# Patient Record
Sex: Male | Born: 1980 | Race: Asian | Marital: Married | Smoking: Former smoker
Health system: Southern US, Community
[De-identification: ages and names within clinical notes are randomized; demographics above are authoritative.]

## PROBLEM LIST (undated history)

## (undated) DIAGNOSIS — I82A12 Acute embolism and thrombosis of left axillary vein: Secondary | ICD-10-CM

## (undated) DIAGNOSIS — S4992XA Unspecified injury of left shoulder and upper arm, initial encounter: Secondary | ICD-10-CM

## (undated) DIAGNOSIS — G540 Brachial plexus disorders: Secondary | ICD-10-CM

## (undated) HISTORY — PX: DEEP NECK LYMPH NODE BIOPSY / EXCISION: SUR126

## (undated) HISTORY — DX: Brachial plexus disorders: G54.0

---

## 2012-08-19 ENCOUNTER — Other Ambulatory Visit: Payer: Self-pay

## 2012-08-19 ENCOUNTER — Inpatient Hospital Stay (HOSPITAL_COMMUNITY)
Admission: EM | Admit: 2012-08-19 | Discharge: 2012-08-24 | DRG: 111 | Disposition: A | Discharge status: Home / Self Care | Payer: BC Managed Care – PPO | Attending: Cardiology | Admitting: Cardiology

## 2012-08-19 ENCOUNTER — Inpatient Hospital Stay (HOSPITAL_COMMUNITY): Payer: BC Managed Care – PPO

## 2012-08-19 ENCOUNTER — Encounter (HOSPITAL_COMMUNITY): Payer: Self-pay | Admitting: Emergency Medicine

## 2012-08-19 DIAGNOSIS — I82A22 Chronic embolism and thrombosis of left axillary vein: Secondary | ICD-10-CM

## 2012-08-19 DIAGNOSIS — Z87891 Personal history of nicotine dependence: Secondary | ICD-10-CM

## 2012-08-19 DIAGNOSIS — I82A19 Acute embolism and thrombosis of unspecified axillary vein: Principal | ICD-10-CM | POA: Diagnosis present

## 2012-08-19 DIAGNOSIS — R609 Edema, unspecified: Secondary | ICD-10-CM | POA: Diagnosis present

## 2012-08-19 DIAGNOSIS — I82A12 Acute embolism and thrombosis of left axillary vein: Secondary | ICD-10-CM | POA: Diagnosis present

## 2012-08-19 DIAGNOSIS — I82B19 Acute embolism and thrombosis of unspecified subclavian vein: Secondary | ICD-10-CM | POA: Diagnosis present

## 2012-08-19 DIAGNOSIS — R600 Localized edema: Secondary | ICD-10-CM | POA: Diagnosis present

## 2012-08-19 HISTORY — DX: Unspecified injury of left shoulder and upper arm, initial encounter: S49.92XA

## 2012-08-19 HISTORY — DX: Acute embolism and thrombosis of left axillary vein: I82.A12

## 2012-08-19 LAB — APTT: aPTT: 30 seconds (ref 24–37)

## 2012-08-19 LAB — CBC
Hemoglobin: 16.8 g/dL (ref 13.0–17.0)
MCH: 30.7 pg (ref 26.0–34.0)
Platelets: 214 10*3/uL (ref 150–400)
RBC: 5.47 MIL/uL (ref 4.22–5.81)
WBC: 14.1 10*3/uL — ABNORMAL HIGH (ref 4.0–10.5)

## 2012-08-19 LAB — PROTIME-INR
INR: 1 (ref 0.00–1.49)
Prothrombin Time: 13.1 seconds (ref 11.6–15.2)

## 2012-08-19 MED ORDER — IOHEXOL 300 MG/ML  SOLN
80.0000 mL | Freq: Once | INTRAMUSCULAR | Status: AC | PRN
  Administered 2012-08-19: 80 mL via INTRAVENOUS

## 2012-08-19 MED ORDER — HEPARIN (PORCINE) IN NACL 100-0.45 UNIT/ML-% IJ SOLN
850.0000 [IU]/h | INTRAMUSCULAR | Status: DC
  Administered 2012-08-19 – 2012-08-21 (×3): 1150 [IU]/h via INTRAVENOUS
  Filled 2012-08-19 (×5): qty 250

## 2012-08-19 MED ORDER — HEPARIN BOLUS VIA INFUSION
4200.0000 [IU] | Freq: Once | INTRAVENOUS | Status: AC
  Administered 2012-08-19: 4200 [IU] via INTRAVENOUS

## 2012-08-19 NOTE — Progress Notes (Signed)
ANTICOAGULATION CONSULT NOTE - Initial Consult  Pharmacy Consult for Heparin Indication: DVT, possible UE  No Known Allergies  Patient Measurements:   Pt reported:  Ht: 5 ft 11 in Wt: 153 lbs (69.5 kg)   Vital Signs: Temp: 98.5 F (36.9 C) (05/12 1715) Temp src: Oral (05/12 1715) BP: 137/97 mmHg (05/12 1715) Pulse Rate: 90 (05/12 1715)  Labs: No results found for this basename: HGB, HCT, PLT, APTT, LABPROT, INR, HEPARINUNFRC, CREATININE, CKTOTAL, CKMB, TROPONINI,  in the last 72 hours  CrCl is unknown because no creatinine reading has been taken and the patient has no height on file.   Medical History: Past Medical History  Diagnosis Date  . Injury of left shoulder     Assessment: 32 y/o M with swelling in the left arm to start IV heparin until DVT can be ruled out. No bleeding issues per pt. No labs back yet but have been ordered STAT and will follow-up.   Goal of Therapy:  Heparin level 0.3-0.7 units/ml Monitor platelets by anticoagulation protocol: Yes   Plan:  -Heparin 4200 units BOLUS x 1 -Start heparin infusion at 1150 units/hr -Check 6 hour HL at 0100 -Daily CBC/HL -Monitor for bleeding -f/u labs  Abran Duke, PharmD Clinical Pharmacist Phone: [phone] Pager: [phone] 08/19/2012 6:07 PM

## 2012-08-19 NOTE — H&P (Signed)
Trevor Spence is an 32 y.o. male.   Chief Complaint:  Swelling in the left arm HPI:   The patient is a 32 year old male with history of left shoulder injury in the past as well as a fatty lipoma resection on the right side of his neck. Patient reports swelling left upper extremity beginning approximately last Thursday or Friday. He does not report any recent trauma. He has not played any sports lately.  He has no family history of blood clotting.  Does have a history of smoking socially but quit approximately 2 years ago.  He was seen by Trevor Felts PA today and upper extremity arterial and venous Dopplers were completed.  He was subsequently sent to [facility].  Patient denies numbness or pain in the left upper ext. As well as nausea, vomiting, fever, shortness of breath, lower extremity edema, chest pain, hematuria, hematochezia.  Medications, none  Past Medical History  Diagnosis Date  . Injury of left shoulder     Past Surgical History  Procedure Laterality Date  . Deep neck lymph node biopsy / excision      History reviewed. No pertinent family history. Social History:  reports that he has quit smoking. He does not have any smokeless tobacco history on file. He reports that he does not drink alcohol or use illicit drugs.  Allergies: Allergies no known allergies   (Not in a hospital admission)  No results found for this or any previous visit (from the past 48 hour(s)). No results found.  Review of Systems  Constitutional: Negative for fever and diaphoresis.  HENT: Negative for congestion and sore throat.   Respiratory: Negative for cough and shortness of breath.   Cardiovascular: Negative for chest pain, orthopnea, leg swelling and PND.  Gastrointestinal: Negative for nausea, vomiting, abdominal pain, blood in stool and melena.  Genitourinary: Negative for hematuria.  Neurological: Negative for weakness.    Blood pressure 137/97, pulse 90, temperature 98.5 F  (36.9 C), temperature source Oral, resp. rate 25, SpO2 99.00%. Physical Exam  Constitutional: He is oriented to person, place, and time. He appears well-developed and well-nourished. No distress.  HENT:  Head: Normocephalic and atraumatic.  Mouth/Throat: No oropharyngeal exudate.  Eyes: EOM are normal. Pupils are equal, round, and reactive to light.  Neck: Normal range of motion. Neck supple.  Cardiovascular: Normal rate, regular rhythm, S1 normal and S2 normal.   No murmur heard. Pulses:      Radial pulses are 2+ on the right side, and 2+ on the left side.       Dorsalis pedis pulses are 2+ on the right side, and 2+ on the left side.  No carotid bruit  Respiratory: Effort normal and breath sounds normal. He has no wheezes. He has no rales. He exhibits no tenderness.  GI: Bowel sounds are normal.  Musculoskeletal: He exhibits edema.  Left upper ext  Lymphadenopathy:    He has no cervical adenopathy.  Neurological: He is alert and oriented to person, place, and time. He exhibits normal muscle tone.  Skin: Skin is warm and dry.  Left upper extremity:  Venous engorgement just below the deltoid.  Psychiatric: He has a normal mood and affect.     Assessment/Plan Principal Problem:   Edema of upper extremity   Plan:  Concern for upper ext venous thrombus.  The patient will be admitted for observation and placed on IV heparin.  Will order upper ext venous doppler.  Labs:  Cbc, bmet, hypercoag panel.  If doppler positive for DVT, will need to consider long-term anticoagulation.  Xarelto would be a good choice.  Trevor Spence 08/19/2012, 5:27 PM  I have seen and evaluated the patient this PM along with Trevor Finlay, PA. I agree with his findings, examination as well as impression recommendations.  Very interesting situation -- young man with no h/o clotting disorder p/w what appears to be DVT in the L Brachiocephalic / Innominate vein.  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   Saw a Chiropractor over the winter & had manipulations that did not help, has been doing stretching & ergonomic positioning with some help.  Despite this, Sx have returned,but over the past few days has noted progressive swelling & tightness in his L arm -- ruddy complexion with new varicosities.  Reportedly had dopplers done @ PCP office (unfortunately we do not have access to the results at this time, after hours).  I agree with hypercoag work-up as Upper extremity DVTs are uncommon.  No Sx of SVC syndrome, as no facial swelling & no IJ engorgement.  He has never been instrumented.  ?? Possibility of thoracic outlet syndrome type external venous compression that may have been made worse by manipulation & persistent unusual arm positioning while working at his computer desk.   He is otherwise completely stable & has no complaints.   I discussed the appropriate imaging modality with Radiology & CT Chest "Venogram" with injection from L arm would hopefully show both vascular and MSK anatomy.    Agree with anticoagulation long term.    More plans to follow after imaging -- I suspect that he may need Vascular Sgx vs. IR consultation, ? For thrombolysis.   Trevor Spence, M.D., M.S. [facility] [address] [place], [place]  [address]  [phone] Pager # [phone] 08/19/2012 7:07 PM

## 2012-08-19 NOTE — ED Notes (Signed)
Pt began to notice some discoloration and swelling to his left arm that started on 08-15-12. Pt has hx of blood clots. Pt states he has some tightness to the arm but no pain. Pt denies cp or sob. Visible discoloration and swelling to the left arm.

## 2012-08-19 NOTE — ED Notes (Signed)
Dr. Leron Croak at bedside.

## 2012-08-20 ENCOUNTER — Encounter (HOSPITAL_COMMUNITY): Payer: Self-pay | Admitting: Cardiology

## 2012-08-20 DIAGNOSIS — G540 Brachial plexus disorders: Secondary | ICD-10-CM

## 2012-08-20 DIAGNOSIS — I82A12 Acute embolism and thrombosis of left axillary vein: Secondary | ICD-10-CM

## 2012-08-20 DIAGNOSIS — R609 Edema, unspecified: Secondary | ICD-10-CM

## 2012-08-20 HISTORY — DX: Acute embolism and thrombosis of left axillary vein: I82.A12

## 2012-08-20 HISTORY — DX: Brachial plexus disorders: G54.0

## 2012-08-20 LAB — CBC
MCH: 30.1 pg (ref 26.0–34.0)
MCHC: 35 g/dL (ref 30.0–36.0)
Platelets: 199 10*3/uL (ref 150–400)
RDW: 13 % (ref 11.5–15.5)

## 2012-08-20 LAB — HEPARIN LEVEL (UNFRACTIONATED)
Heparin Unfractionated: 0.47 IU/mL (ref 0.30–0.70)
Heparin Unfractionated: 0.54 IU/mL (ref 0.30–0.70)
Heparin Unfractionated: 0.63 IU/mL (ref 0.30–0.70)

## 2012-08-20 NOTE — Progress Notes (Signed)
ANTICOAGULATION CONSULT NOTE - FOLLOW-UP   HL = 0.47 (goal 0.3 - 0.7 units/mL) Heparin dosing weight = 70 kg   Assessment: 32 YOM on IV heparin for thrombus seen within the central left axillary vein and left subclavian vein.  Heparin level therapeutic; no bleeding reported.   Plan: - Continue heparin gtt at 1150 units/hr - F/U AM labs  - F/U start oral anticoagulation     Mirtie Bastyr D. Laney Potash, PharmD, BCPS Pager:  [phone] 08/20/2012, 1:48 AM

## 2012-08-20 NOTE — Progress Notes (Signed)
Utilization review completed. Ferdinand Revoir, RN, BSN. 

## 2012-08-20 NOTE — Progress Notes (Signed)
Subjective: Anxious about treatment  Objective: Vital signs in last 24 hours: Temp:  [97.9 F (36.6 C)-98.5 F (36.9 C)] 98.4 F (36.9 C) (05/13 1358) Pulse Rate:  [54-90] 73 (05/13 1358) Resp:  [14-20] 18 (05/13 1358) BP: (109-141)/(64-88) 126/69 mmHg (05/13 1358) SpO2:  [98 %-100 %] 100 % (05/13 1358) Weight:  [154 lb 1.6 oz (69.9 kg)] 154 lb 1.6 oz (69.9 kg) (05/12 2002) Weight change:  Last BM Date: 08/19/12 Intake/Output from previous day: 05/12 0701 - 05/13 0700 In: 92.8 [I.V.:92.8] Out: -  Intake/Output this shift: Total I/O In: 421.4 [P.O.:320; I.V.:101.4] Out: -   PE:  Per Dr. Allyson Sabal General:alert and oriented, NAD Heart:S1S2 RRR, without murmur gallup rub or click Lungs:clear without rales rhonchi or wheezes Abd:+ BS soft non tender Ext:Lt upper arm swollen with vascular congestion.    Lab Results:  Recent Labs  08/19/12 1827 08/20/12 0950  WBC 14.1* 9.9  HGB 16.8 15.2  HCT 46.5 43.4  PLT 214 199     EKG: Orders placed during the hospital encounter of 08/19/12  . EKG    Studies/Results: Ct Chest W Contrast  08/19/2012  *RADIOLOGY REPORT*  Clinical Data: Left upper extremity thrombus.  Suspected thoracic outlet syndrome.  CT CHEST WITH CONTRAST  Technique:  Multidetector CT imaging of the chest was performed following the standard protocol during bolus administration of intravenous contrast.  Contrast: 80mL OMNIPAQUE IOHEXOL 300 MG/ML  SOLN  Comparison: None.  Findings: Injection of contrast was performed in the left upper extremity.  This demonstrates filling defects within the left subclavian vein and central left axillary vein.  The subclavian vein is not well visualized near the thoracic inlet.  Extensive collateral venous channels noted in the left upper extremity and upper chest/neck with drainage through the left internal jugular vein.  Heart is normal size.  Aorta is normal caliber. No mediastinal, hilar, or axillary adenopathy.  The Lungs are  clear.  No focal airspace opacities or suspicious nodules.  No effusions. Imaging into the upper abdomen shows no acute findings.  Tiny low density area within the superior right hepatic lobe is too small to characterize, most likely small cysts.  No acute bony abnormality.  IMPRESSION: Thrombus seen within the central left axillary vein and left subclavian vein.  The left subclavian vein is difficult to visualize near the thoracic inlet.  Extensive collateral venous channels in the left neck and chest with drainage through the left internal jugular vein.   Original Report Authenticated By: Charlett Nose, M.D.     Medications: I have reviewed the patient's current medications. Scheduled Meds:  Continuous Infusions: . heparin 1,150 Units/hr (08/20/12 1317)   PRN Meds:.  Assessment/Plan: Principal Problem:   Edema of upper extremity Active Problems:   DVT of left axillary vein, acute, and Lt subclavian vein  PLAN: hyper coag. Panel we believe drawn at [facility] medical.  See Dr. Hazle Coca note.  LOS: 1 day   Time spent with pt. :20 minutes including MD time.Leone Brand  Nurse Practitioner Certified Pager [phone] 08/20/2012, 4:47 PM   Agree with note written by Nada Boozer RNP  LUE trombosis by CT. No clear etiology. On IV hep. Hyper coag panel sent. Will get IR to see tomorrow. May need venogram and thrombolysis. ? Thoracic outlet syndrome.  Runell Gess 08/20/2012 5:07 PM

## 2012-08-20 NOTE — Progress Notes (Signed)
Pt inquiring when MD when round, pt little anxious to know the plan, ?meds, surgery ???. Nada Boozer, NP with Dr Herbie Baltimore paged, plan to round on pt within the hour. Pt informed.

## 2012-08-20 NOTE — Care Management Note (Signed)
    Page 1 of 1   08/23/2012     12:43:21 PM   CARE MANAGEMENT NOTE 08/23/2012  Patient:  Trevor Spence, Trevor Spence   Account Number:  [id number]  Date Initiated:  08/20/2012  Documentation initiated by:  Letha Cape  Subjective/Objective Assessment:   dx edema of uppper ext  admit- lives with spouse.     Action/Plan:   Anticipated DC Date:  08/22/2012   Anticipated DC Plan:  HOME/SELF CARE      DC Planning Services  CM consult      Choice offered to / List presented to:             Status of service:  In process, will continue to follow Medicare Important Message given?   (If response is "NO", the following Medicare IM given date fields will be blank) Date Medicare IM given:   Date Additional Medicare IM given:    Discharge Disposition:    Per UR Regulation:  Reviewed for med. necessity/level of care/duration of stay  If discussed at Long Length of Stay Meetings, dates discussed:    Comments:  08-23-12 12:40am [name], RNBSN - [phone] PER [facility] XARELTO 15 MGS OR 20 IS $60/COPAY FOR 30 DAYS - NO PRIOR AUTH REQ - CAN USE ANY LOCAL DRUG.  Can give 30 day free card to start with if choose to go this route.  08-22-13  2:30pm [name] - [phone] tx to ICU for thoracic outlet syndrome.   Infusion catheter placed for overnight TNK administration.   08/20/12 16:42 Letha Cape RN, BSN [phone] patient lives with spouse, NCM will continue to follow for dc needs.

## 2012-08-20 NOTE — Progress Notes (Signed)
Pharmacy Consult-Anticoagulation  Pharmacy Consult:  32 y/o male is currently on heparin protocol for L-axillary vein thrombus   Current Labs: Hematology  Recent Labs  08/19/12 1827 08/20/12 0032 08/20/12 0950  HGB 16.8  --  15.2  HCT 46.5  --  43.4  PLT 214  --  199  APTT 30  --   --   LABPROT 13.1  --   --   INR 1.00  --   --   HEPARINUNFRC  --  0.47 0.54   Lab Results  Component Value Date   INR 1.00 08/19/2012   Current Meds:  [COMPLETED] heparin 4,200 Units Intravenous Once  Heparin Infusion        1150 units/hr  Assessment:  Heparin level is therapeutic 0.54 units/ml.  No bleeding complications observed.  Goal/Plan:  Heparin goal is 0.3 - 0.7 units/ml.  Heparin will be continued at the same rate, 1150 units/hr.    Daily Heparin level and CBC while on Heparin.  Monitor for bleeding complications   Laurena Bering, Pharm.D. 08/20/2012  3:05 PM

## 2012-08-21 DIAGNOSIS — R609 Edema, unspecified: Secondary | ICD-10-CM

## 2012-08-21 LAB — COMPREHENSIVE METABOLIC PANEL
AST: 15 U/L (ref 0–37)
Albumin: 3.6 g/dL (ref 3.5–5.2)
Alkaline Phosphatase: 50 U/L (ref 39–117)
CO2: 28 mEq/L (ref 19–32)
Chloride: 105 mEq/L (ref 96–112)
Potassium: 3.3 mEq/L — ABNORMAL LOW (ref 3.5–5.1)
Total Bilirubin: 0.5 mg/dL (ref 0.3–1.2)

## 2012-08-21 LAB — HEPARIN LEVEL (UNFRACTIONATED): Heparin Unfractionated: 0.63 IU/mL (ref 0.30–0.70)

## 2012-08-21 LAB — CBC
HCT: 41.8 % (ref 39.0–52.0)
Platelets: 204 10*3/uL (ref 150–400)
RBC: 4.91 MIL/uL (ref 4.22–5.81)
RDW: 12.5 % (ref 11.5–15.5)
WBC: 9.3 10*3/uL (ref 4.0–10.5)

## 2012-08-21 MED ORDER — POTASSIUM CHLORIDE CRYS ER 20 MEQ PO TBCR
40.0000 meq | EXTENDED_RELEASE_TABLET | Freq: Once | ORAL | Status: AC
  Administered 2012-08-21: 40 meq via ORAL
  Filled 2012-08-21: qty 2

## 2012-08-21 NOTE — Progress Notes (Addendum)
Admitted pt.to 5532,from ED ,AAO X4,bec.of swelling & sl discolored LT.Upper arm.He lives at home with wife.Oriented pt. To the unit & instructed to use call bell for any help.In pt.ID bracelet applied to pt.after verifying w/ pt.;T=97.9;P=74;R=16;B/P=127/85;02 SAT.=100 % RA. Will continue to monitor pt./.Stevon Gough.RN

## 2012-08-21 NOTE — Progress Notes (Signed)
[facility]  Subjective: Arm hurts in certain positions.  Swelling better.  Objective: Vital signs in last 24 hours: Temp:  [98 F (36.7 C)-99 F (37.2 C)] 98 F (36.7 C) (05/14 0607) Pulse Rate:  [62-73] 62 (05/14 0607) Resp:  [16-18] 16 (05/14 0607) BP: (100-126)/(63-69) 100/65 mmHg (05/14 0607) SpO2:  [98 %-100 %] 98 % (05/14 0607) Last BM Date: 08/20/12  Intake/Output from previous day: 05/13 0701 - 05/14 0700 In: 708.4 [P.O.:470; I.V.:238.4] Out: -  Intake/Output this shift:    Medications Current Facility-Administered Medications  Medication Dose Route Frequency Provider Last Rate Last Dose  . heparin ADULT infusion 100 units/mL (25000 units/250 mL)  1,150 Units/hr Intravenous Continuous Abran Duke, RPH 11.5 mL/hr at 08/20/12 1317 1,150 Units/hr at 08/20/12 1317    PE: General appearance: alert, cooperative and no distress Lungs: clear to auscultation bilaterally Heart: regular rate and rhythm, S1, S2 normal, no murmur, click, rub or gallop Extremities: 1+ Upper left ext edema Pulses: 2+ and symmetric Neurologic: Grossly normal  Lab Results:   Recent Labs  08/19/12 1827 08/20/12 0950 08/21/12 0625  WBC 14.1* 9.9 9.3  HGB 16.8 15.2 14.8  HCT 46.5 43.4 41.8  PLT 214 199 204   BMET  Recent Labs  08/21/12 0625  NA 140  K 3.3*  CL 105  CO2 28  GLUCOSE 96  BUN 12  CREATININE 1.09  CALCIUM 9.5   PT/INR  Recent Labs  08/19/12 1827  LABPROT 13.1  INR 1.00      Assessment/Plan   Principal Problem:   DVT of left axillary vein, acute, and Lt subclavian vein Active Problems:   Edema of upper extremity  Plan:  IR consult today.  Drawing Garment/textile technologist.  There was a problem with the one from [facility] and it was not completed.  Continue IV heparin.  Longterm anticoag TBD.    LOS: 2 days    HAGER, BRYAN 08/21/2012 10:18 AM  I have seen and evaluated the patient this PM along with [name],  PA. I agree with his findings, examination as well as impression recommendations.  Admitted with LSCV & Axiallry thrombosis -- confirmed by CT Venography. Unfortunately hypercoagulation work-up was corrupt -- now that he is no heparin, not sure how helpful it would be (will need to check after 3-6 month when anticoagulation can be held.  Otherwise, unsure of etiology -- ? Abnormal work ergodynamics, vs. Thoracic outlet syndrome related.  Appreciate VIR consultation for Venogram & possible Angioplasty-stenting / direct thrombolysis -- planned tomorrow.  For now, continue on IV Heparin & pain control.   Marykay Lex, M.D., M.S. [facility] [address] [place], [place]  [address]  [phone] Pager # [phone] 08/21/2012 12:56 PM

## 2012-08-21 NOTE — Progress Notes (Signed)
Patient ID: Trevor Spence, male   DOB: [birth year], 32 y.o.   MRN: [id number] Request received for left upper venography with possible angioplasty/stenting/thrombolysis in pt with recently diagnosed left axillary/subclavian DVT. Pt reports approx. 1 week hx of LUE swelling. No hx trauma, cancer or recent major surgery.  No prior hx of thrombosis. Imaging studies were reviewed by Dr. Miles Costain. Additional PMH as below.  Exam: pt awake/alert; chest- CTA bilat., heart- RRR; abd- soft,+BS,NT/ND; ext- 1-2+ LUE edema from deltoid down; no RUE/RLE/LLE edema; intact peripheral pulses; sens fxn intact.   Filed Vitals:   08/20/12 0627 08/20/12 1358 08/20/12 2024 08/21/12 0607  BP: 109/64 126/69 110/63 100/65  Pulse: 54 73 63 62  Temp: 97.9 F (36.6 C) 98.4 F (36.9 C) 99 F (37.2 C) 98 F (36.7 C)  TempSrc: Oral Oral Oral Oral  Resp: 18 18 16 16   Height:      Weight:      SpO2: 100% 100% 98% 98%   Past Medical History  Diagnosis Date  . Injury of left shoulder   . DVT of left axillary vein, acute 08/20/2012   Past Surgical History  Procedure Laterality Date  . Deep neck lymph node biopsy / excision     Ct Chest W Contrast  08/19/2012   *RADIOLOGY REPORT*  Clinical Data: Left upper extremity thrombus.  Suspected thoracic outlet syndrome.  CT CHEST WITH CONTRAST  Technique:  Multidetector CT imaging of the chest was performed following the standard protocol during bolus administration of intravenous contrast.  Contrast: 80mL OMNIPAQUE IOHEXOL 300 MG/ML  SOLN  Comparison: None.  Findings: Injection of contrast was performed in the left upper extremity.  This demonstrates filling defects within the left subclavian vein and central left axillary vein.  The subclavian vein is not well visualized near the thoracic inlet.  Extensive collateral venous channels noted in the left upper extremity and upper chest/neck with drainage through the left internal jugular vein.  Heart is normal size.  Aorta is normal  caliber. No mediastinal, hilar, or axillary adenopathy.  The Lungs are clear.  No focal airspace opacities or suspicious nodules.  No effusions. Imaging into the upper abdomen shows no acute findings.  Tiny low density area within the superior right hepatic lobe is too small to characterize, most likely small cysts.  No acute bony abnormality.  IMPRESSION: Thrombus seen within the central left axillary vein and left subclavian vein.  The left subclavian vein is difficult to visualize near the thoracic inlet.  Extensive collateral venous channels in the left neck and chest with drainage through the left internal jugular vein.   Original Report Authenticated By: Charlett Nose, M.D.  Results for orders placed during the hospital encounter of 08/19/12  CBC      Result Value Range   WBC 14.1 (*) 4.0 - 10.5 K/uL   RBC 5.47  4.22 - 5.81 MIL/uL   Hemoglobin 16.8  13.0 - 17.0 g/dL   HCT 78.2  95.6 - 21.3 %   MCV 85.0  78.0 - 100.0 fL   MCH 30.7  26.0 - 34.0 pg   MCHC 36.1 (*) 30.0 - 36.0 g/dL   RDW 08.6  57.8 - 46.9 %   Platelets 214  150 - 400 K/uL  APTT      Result Value Range   aPTT 30  24 - 37 seconds  PROTIME-INR      Result Value Range   Prothrombin Time 13.1  11.6 - 15.2 seconds   INR  1.00  0.00 - 1.49  HEPARIN LEVEL (UNFRACTIONATED)      Result Value Range   Heparin Unfractionated 0.47  0.30 - 0.70 IU/mL  HEPARIN LEVEL (UNFRACTIONATED)      Result Value Range   Heparin Unfractionated 0.54  0.30 - 0.70 IU/mL  CBC      Result Value Range   WBC 9.9  4.0 - 10.5 K/uL   RBC 5.05  4.22 - 5.81 MIL/uL   Hemoglobin 15.2  13.0 - 17.0 g/dL   HCT 16.1  09.6 - 04.5 %   MCV 85.9  78.0 - 100.0 fL   MCH 30.1  26.0 - 34.0 pg   MCHC 35.0  30.0 - 36.0 g/dL   RDW 40.9  81.1 - 91.4 %   Platelets 199  150 - 400 K/uL  HEPARIN LEVEL (UNFRACTIONATED)      Result Value Range   Heparin Unfractionated 0.63  0.30 - 0.70 IU/mL  HEPARIN LEVEL (UNFRACTIONATED)      Result Value Range   Heparin Unfractionated  0.63  0.30 - 0.70 IU/mL  CBC      Result Value Range   WBC 9.3  4.0 - 10.5 K/uL   RBC 4.91  4.22 - 5.81 MIL/uL   Hemoglobin 14.8  13.0 - 17.0 g/dL   HCT 78.2  95.6 - 21.3 %   MCV 85.1  78.0 - 100.0 fL   MCH 30.1  26.0 - 34.0 pg   MCHC 35.4  30.0 - 36.0 g/dL   RDW 08.6  57.8 - 46.9 %   Platelets 204  150 - 400 K/uL  COMPREHENSIVE METABOLIC PANEL      Result Value Range   Sodium 140  135 - 145 mEq/L   Potassium 3.3 (*) 3.5 - 5.1 mEq/L   Chloride 105  96 - 112 mEq/L   CO2 28  19 - 32 mEq/L   Glucose, Bld 96  70 - 99 mg/dL   BUN 12  6 - 23 mg/dL   Creatinine, Ser 6.29  0.50 - 1.35 mg/dL   Calcium 9.5  8.4 - 52.8 mg/dL   Total Protein 6.8  6.0 - 8.3 g/dL   Albumin 3.6  3.5 - 5.2 g/dL   AST 15  0 - 37 U/L   ALT 25  0 - 53 U/L   Alkaline Phosphatase 50  39 - 117 U/L   Total Bilirubin 0.5  0.3 - 1.2 mg/dL   GFR calc non Af Amer 87 (*) >90 mL/min   GFR calc Af Amer >90  >90 mL/min   A/P: Pt with acute left axillary/subclavian venous thrombosis, most likely related to thoracic outlet syndrome. Plan is for LUE venography with possible angioplasty/stenting/catheter directed thrombolysis on 5/15. Details/risks of procedure d/w pt with his understanding and consent. If thrombolysis initiated 5/15 pt will be transferred to ICU for monitoring with daily venography to assess progress. Dr. Miles Costain has seen and evaluated pt .

## 2012-08-21 NOTE — Progress Notes (Signed)
ANTICOAGULATION CONSULT NOTE - Follow Up Consult  Pharmacy Consult for Heparin Indication: DVT  No Known Allergies  Patient Measurements: Height: 5\' 10"  (177.8 cm) Weight: 154 lb 1.6 oz (69.9 kg) IBW/kg (Calculated) : 73  Vital Signs: Temp: 98 F (36.7 C) (05/14 0607) Temp src: Oral (05/14 0607) BP: 100/65 mmHg (05/14 0607) Pulse Rate: 62 (05/14 0607)  Labs:  Recent Labs  08/19/12 1827  08/20/12 0950 08/20/12 1500 08/21/12 0625  HGB 16.8  --  15.2  --  14.8  HCT 46.5  --  43.4  --  41.8  PLT 214  --  199  --  204  APTT 30  --   --   --   --   LABPROT 13.1  --   --   --   --   INR 1.00  --   --   --   --   HEPARINUNFRC  --   < > 0.54 0.63 0.63  CREATININE  --   --   --   --  1.09  < > = values in this interval not displayed.  Estimated Creatinine Clearance: 94.4 ml/min (by C-G formula based on Cr of 1.09).   Medications:  Infusions:  . heparin 1,150 Units/hr (08/20/12 1317)    Assessment: 32 year old male who continues on anticoagulation with Heparin for left arm DVT.  Heparin level is therapeutic, CBC is stable.  Goal of Therapy:  Heparin level 0.3-0.7 units/ml Monitor platelets by anticoagulation protocol: Yes   Plan:  Continue Heparin at 1150 units/hr Check AM Heparin level and CBC  Estella Husk, Pharm.D., BCPS Clinical Pharmacist  Phone [phone] Pager [phone] 08/21/2012, 12:01 PM

## 2012-08-22 ENCOUNTER — Inpatient Hospital Stay (HOSPITAL_COMMUNITY): Payer: BC Managed Care – PPO

## 2012-08-22 DIAGNOSIS — M7989 Other specified soft tissue disorders: Secondary | ICD-10-CM

## 2012-08-22 DIAGNOSIS — G540 Brachial plexus disorders: Secondary | ICD-10-CM

## 2012-08-22 DIAGNOSIS — I82A19 Acute embolism and thrombosis of unspecified axillary vein: Principal | ICD-10-CM

## 2012-08-22 LAB — FACTOR 5 LEIDEN

## 2012-08-22 LAB — CBC
HCT: 39.7 % (ref 39.0–52.0)
Hemoglobin: 14.3 g/dL (ref 13.0–17.0)
MCH: 30.5 pg (ref 26.0–34.0)
MCV: 85.3 fL (ref 78.0–100.0)
Platelets: 216 10*3/uL (ref 150–400)
RBC: 5.05 MIL/uL (ref 4.22–5.81)
WBC: 9.8 10*3/uL (ref 4.0–10.5)

## 2012-08-22 LAB — PROTEIN S ACTIVITY: Protein S Activity: 111 % (ref 69–129)

## 2012-08-22 LAB — BASIC METABOLIC PANEL
CO2: 24 mEq/L (ref 19–32)
Calcium: 9.5 mg/dL (ref 8.4–10.5)
Creatinine, Ser: 1.28 mg/dL (ref 0.50–1.35)

## 2012-08-22 LAB — LUPUS ANTICOAGULANT PANEL
PTT Lupus Anticoagulant: 117.1 secs — ABNORMAL HIGH (ref 28.0–43.0)
PTTLA 4:1 Mix: 115.2 secs — ABNORMAL HIGH (ref 28.0–43.0)
PTTLA Confirmation: 2.9 secs (ref <8.0)

## 2012-08-22 LAB — PROTIME-INR: Prothrombin Time: 13.1 seconds (ref 11.6–15.2)

## 2012-08-22 LAB — HOMOCYSTEINE: Homocysteine: 10.1 umol/L (ref 4.0–15.4)

## 2012-08-22 LAB — HEPARIN LEVEL (UNFRACTIONATED): Heparin Unfractionated: 0.11 IU/mL — ABNORMAL LOW (ref 0.30–0.70)

## 2012-08-22 LAB — CARDIOLIPIN ANTIBODIES, IGG, IGM, IGA
Anticardiolipin IgG: 5 GPL U/mL — ABNORMAL LOW (ref <23)
Anticardiolipin IgM: 3 MPL U/mL — ABNORMAL LOW (ref <11)

## 2012-08-22 LAB — BETA-2-GLYCOPROTEIN I ABS, IGG/M/A: Beta-2-Glycoprotein I IgA: 0 A Units (ref <20)

## 2012-08-22 LAB — MRSA PCR SCREENING: MRSA by PCR: NEGATIVE

## 2012-08-22 MED ORDER — TENECTEPLASE 50 MG IV KIT
0.2000 mg/h | PACK | INTRAVENOUS | Status: DC
  Administered 2012-08-22: 0.2 mg/h
  Filled 2012-08-22: qty 0.5

## 2012-08-22 MED ORDER — MIDAZOLAM HCL 2 MG/2ML IJ SOLN
INTRAMUSCULAR | Status: AC | PRN
  Administered 2012-08-22: 0.5 mg via INTRAVENOUS
  Administered 2012-08-22: 1 mg via INTRAVENOUS
  Administered 2012-08-22: 0.5 mg via INTRAVENOUS

## 2012-08-22 MED ORDER — ONDANSETRON HCL 4 MG/2ML IJ SOLN
4.0000 mg | Freq: Four times a day (QID) | INTRAMUSCULAR | Status: DC | PRN
  Administered 2012-08-22: 4 mg via INTRAVENOUS
  Filled 2012-08-22: qty 2

## 2012-08-22 MED ORDER — SODIUM CHLORIDE 0.9 % IJ SOLN
3.0000 mL | Freq: Two times a day (BID) | INTRAMUSCULAR | Status: DC
  Administered 2012-08-22 – 2012-08-24 (×3): 3 mL via INTRAVENOUS

## 2012-08-22 MED ORDER — MORPHINE SULFATE 4 MG/ML IJ SOLN: 5.0000 mg | INTRAMUSCULAR | Status: DC | PRN

## 2012-08-22 MED ORDER — HEPARIN (PORCINE) IN NACL 100-0.45 UNIT/ML-% IJ SOLN
950.0000 [IU]/h | INTRAMUSCULAR | Status: DC
  Administered 2012-08-22: 950 [IU]/h via INTRAVENOUS
  Filled 2012-08-22 (×2): qty 250

## 2012-08-22 MED ORDER — MIDAZOLAM HCL 2 MG/2ML IJ SOLN: 1.0000 mg | INTRAMUSCULAR | Status: DC | PRN

## 2012-08-22 MED ORDER — IOHEXOL 300 MG/ML  SOLN
100.0000 mL | Freq: Once | INTRAMUSCULAR | Status: AC | PRN
  Administered 2012-08-22: 60 mL via INTRAVENOUS

## 2012-08-22 MED ORDER — TENECTEPLASE 50 MG IV KIT: 0.5000 mg/h | PACK | INTRAVENOUS | Status: DC

## 2012-08-22 MED ORDER — FENTANYL CITRATE 0.05 MG/ML IJ SOLN
INTRAMUSCULAR | Status: AC | PRN
  Administered 2012-08-22 (×3): 25 ug via INTRAVENOUS

## 2012-08-22 MED ORDER — HEPARIN (PORCINE) IN NACL 100-0.45 UNIT/ML-% IJ SOLN
800.0000 [IU]/h | INTRAMUSCULAR | Status: DC
  Filled 2012-08-22: qty 250

## 2012-08-22 MED ORDER — TENECTEPLASE 50 MG IV KIT
0.2500 mg/h | PACK | INTRAVENOUS | Status: DC
  Administered 2012-08-22: 0.25 mg/h via INTRAVENOUS
  Filled 2012-08-22: qty 0.5

## 2012-08-22 MED ORDER — SODIUM CHLORIDE 0.9 % IJ SOLN
3.0000 mL | INTRAMUSCULAR | Status: DC | PRN
  Administered 2012-08-24: 3 mL via INTRAVENOUS

## 2012-08-22 MED ORDER — SODIUM CHLORIDE 0.9 % IV SOLN: 250.0000 mL | INTRAVENOUS | Status: DC | PRN

## 2012-08-22 NOTE — Consult Note (Signed)
[facility] [place]  Referred by:  Dr. Herbie Baltimore  Reason for referral: possible venous thoracic outlet syndrome  History of Present Illness  Trevor Spence is a 32 y.o. (November 13, 1980) male who presents with chief complaint: swollen left arm.  Patient had onset of left arm swelling that has progressive increased since two weeks ago.  No acute injury noted.  This patient notes a remote history of left shoulder injury without any known fracture history.  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   He has no history of sporting injuries involving left arm.  He has no known history of thrombophilia.  He has never had any blood clots.  He denies any neurologic symptoms in either arm.  He has had neck and shoulder pain for which he was under going chiropractic manipulation.  The patient has never smoked.    Past Medical History  Diagnosis Date  . Injury of left shoulder   . DVT of left axillary vein, acute 08/20/2012    Past Surgical History  Procedure Laterality Date  . Deep neck lymph node biopsy / excision      History   Social History  . Marital Status: Married    Spouse Name: N/A    Number of Children: N/A  . Years of Education: N/A   Occupational History  . Not on file.   Social History Main Topics  . Smoking status: Passive Smoke Exposure - Never Smoker  . Smokeless tobacco: Never Used  . Alcohol Use: No  . Drug Use: No  . Sexually Active: Yes   Other Topics Concern  . Not on file   Social History Narrative  . No narrative on file   Family History Both parents alive and healthy without any medical problems.  Medications No medications prior to admission  No Known Allergies  REVIEW OF SYSTEMS:  (Positives checked otherwise negative)  CARDIOVASCULAR:  []  chest pain, []  chest pressure, []  palpitations, []  shortness of breath when laying flat, []  shortness of breath with exertion,  []  pain in feet when walking, []  pain in  feet when laying flat, []  history of blood clot in veins (DVT), []  history of phlebitis, []  swelling in legs, []  varicose veins  PULMONARY:  []  productive cough, []  asthma, []  wheezing  NEUROLOGIC:  []  weakness in arms or legs, []  numbness in arms or legs, []  difficulty speaking or slurred speech, []  temporary loss of vision in one eye, []  dizziness  HEMATOLOGIC:  []  bleeding problems, []  problems with blood clotting too easily  MUSCULOSKEL:  []  joint pain, []  joint swelling  GASTROINTEST:  []  vomiting blood, []  blood in stool     GENITOURINARY:  []  burning with urination, []  blood in urine  PSYCHIATRIC:  []  history of major depression  INTEGUMENTARY:  []  rashes, []  ulcers  CONSTITUTIONAL:  []  fever, []  chills  Physical Examination  Filed Vitals:   08/22/12 1201 08/22/12 1210 08/22/12 1231 08/22/12 1300  BP: 132/100 131/98 136/95 138/87  Pulse: 61 53 52 57  Temp:    97.8 F (36.6 C)  TempSrc:    Oral  Resp: 20 20 20 17   Height:    5\' 10"  (1.778 m)  Weight:      SpO2: 97% 98% 97% 100%    Body mass index is 22.11 kg/(m^2).  General: A&O x 3, WDWN, thin  Head: [place]/AT  Ear/Nose/Throat: Hearing grossly intact, nares w/o erythema or drainage, oropharynx w/o Erythema/Exudate, Mallampati score: 2  Eyes: PERRLA, EOMI  Neck: Supple, no nuchal rigidity, no palpable LAD  Pulmonary: Sym exp, good air movt, CTAB, no rales, rhonchi, & wheezing  Cardiac: RRR, Nl S1, S2, no Murmurs, rubs or gallops  Vascular: Vessel Right Left  Radial Palpable Palpable  Ulnar Faintly Palpable Faintly Palpable  Brachial Palpable Not palpated due to sheath and bandage at this location  Carotid Palpable, without bruit Palpable, without bruit  Aorta Not palpable N/A  Femoral Palpable Palpable  Popliteal Not palpable Not palpable  PT Palpable Palpable  DP Palpable Palpable   Gastrointestinal: soft, NTND, -G/R, - HSM, - masses, - CVAT B  Musculoskeletal: BLE and RUE M/S 5/5 throughout , LUE  not tested due to infusion catheter placement, Extremities without ischemic changes , L arm swollen with obvious venous engorgement  Neurologic: CN 2-12 intact , Pain and light touch intact in extremities , Motor exam as listed above  Psychiatric: Judgment intact, Mood & affect appropriate for pt's clinical situation  Dermatologic: See M/S exam for extremity exam, no rashes otherwise noted  Lymph : No Cervical, Axillary, or Inguinal lymphadenopathy   Laboratory: CBC:    Component Value Date/Time   WBC 10.1 08/22/2012 0620   RBC 5.05 08/22/2012 0620   HGB 15.4 08/22/2012 0620   HCT 43.1 08/22/2012 0620   PLT 216 08/22/2012 0620   MCV 85.3 08/22/2012 0620   MCH 30.5 08/22/2012 0620   MCHC 35.7 08/22/2012 0620   RDW 12.7 08/22/2012 0620    BMP:    Component Value Date/Time   NA 140 08/22/2012 0620   K 3.8 08/22/2012 0620   CL 104 08/22/2012 0620   CO2 24 08/22/2012 0620   GLUCOSE 108* 08/22/2012 0620   BUN 15 08/22/2012 0620   CREATININE 1.28 08/22/2012 0620   CALCIUM 9.5 08/22/2012 0620   GFRNONAA 72* 08/22/2012 0620   GFRAA 83* 08/22/2012 0620    Coagulation: Lab Results  Component Value Date   INR 1.00 08/22/2012   INR 1.00 08/19/2012   No results found for this basename: PTT   Radiology: Ir Infusion Thrombol Venous Initial (ms)  08/22/2012   *RADIOLOGY REPORT*  Indication: Symptomatic left upper extremity DVT.  The patient reports a history of left arm swelling beginning approximately 2-3 weeks ago.  The left arm swelling acutely worsened approximately 1 week ago ultimately resulting in evaluation with chest CT (performed 08/19/2012) which demonstrated thrombus within the central left axillary and left subclavian veins concerning for thoracic outlet syndrome.  1.  LEFT UPPER EXTERMITY VENOGRAM 2.  MECHANICAL THROMBECTOMY WITH USE OF THE ANGIO JET DEVICE 3.  PLACEMENT OF INFUSION CATHETER 4.  ULTRASOUND GUIDANCE FOR VASCULAR ACCESS  Comparisons: Chest CT - 08/19/2012  Intravenous  Medications: Fentanyl 75 mcg IV; Versed 2 mg IV  Contrast: 60 ml Omnipaque-300  Total Moderate Sedation Time: 40 minutes  Fluoroscopy Time: 7 minutes; 6 seconds  Complications: None immediate  Technique:  Informed written consent was obtained from the patient after a discussion of the risks, benefits and alternatives to treatment. Questions regarding the procedure were encouraged and answered.  A timeout was performed prior to the initiation of the procedure.  The patient was placed supine on the fluoroscopy table. Preprocedural ultrasound scanning demonstrates wide patency of the brachial and basilic veins within the mid and distal aspect of the left humerus.  The skin overlying the medial distal aspect of the left humerus was prepped and draped in the usual sterile fashion, and a sterile drape was applied  covering the operative field. Maximum barrier sterile technique with sterile gowns and gloves were used for the procedure.  Under direct ultrasound guidance, the basilic vein was access with a micropuncture kit just central to the medial epicondyle after the overlying soft tissues were anesthetized with 1% lidocaine.  An ultrasound image was saved for documentation purposed.  A left upper extremity venogram was performed in multiple stations to the chest via the use of the inner stiffener of the micropuncture kit. Ultimately, the micropuncture sheath was exchanged for a short 6- [place] vascular sheath over a guidewire.  A repeat venogram was performed through the side arm of the vascular sheath.  With the use of a regular glide wire, a Kumpe catheter was advanced through the moderate length segment occlusion within the mid aspect of the subclavian vein to the level of the SVC.  Limited venograms were performed from the SVC to confirm appropriate intraluminal positioning.  With the use of a Kumpe catheter, an exchange length Rosen wire was advanced to the level of the IVC.  Under intermittent fluoroscopic  guidance, the 6-French vascular sheath was exchanged for a 7-French, 35 cm vascular sheath with radiopaque tip advanced to the level of the left axilla. Mechanical thrombectomy was performed with the use of the Angio- Seal device.  Post mechanical thrombectomy central venogram was performed.  A 10 cm/45-cm infusion catheter was advanced with sideports extending from the central aspect of the subclavian occlusion to the peripheral aspect of the vascular sheath within the left axilla.  The vascular sheath was sutured in place with an interrupted suture.  A dressing was placed.  The patient tolerated the procedure well without immediate postprocedural complication.  The infusion catheter was connected to [facility] and the infusion was initiated.  Findings:  Initial ultrasound scanning demonstrates wide patency of the mid and distal humeral aspect of the basilic and brachial veins which was subsequently confirmed with venography.  Left upper extremity venogram demonstrates a moderate length occlusion of the left subclavian vein at the articulation of the clavicle and first rib.  There is a minimal amount of nonocclusive thrombus within the central aspect of the left axillary vein.  The subclavian vein occlusion was successfully traversed and Mechanical thrombectomy was performed with the use of the AngioJet device.  Post mechanical thrombectomy venogram demonstrates restoration of antegrade flow with persistent moderate to severe irregular narrowing of the previously occluded segment with nonocclusive mural thrombus.  Successful placement of an infusion catheter traversing the occluded segment of the subclavian and central axillary veins.  Impression:  1.  Findings compatible with thoracic outlet syndrome with moderate length occlusion of the subclavian vein regional to the confluence of the clavicle and first rib.  There is a minimal amount of nonocclusive thrombus within the central aspect of the left axillary vein. 2.   Successful percutaneous traversal of the subclavian vein occlusion and restoration of antegrade flow with the use of the AngioJet device.  A moderate amount of residual nonocclusive mural thrombus is seen at the site of prior subclavian vein occlusion.  3.  Successful placement of an infusion catheter across the subclavian vein occlusion and initiation of TNKase infusion.  Plan:  The patient will be transferred to ICU for overnight TNKase infusion and will return tomorrow for repeat venogram and possible intervention.   Original Report Authenticated By: Tacey Ruiz, MD   I reviewed the IR L arm and central venogram, the subclavian DVT is at the confluence just distal to the  clavicle.  The position is suspicious for mechanical compression of the left subclavian vein.    Medical Decision Making  Taimur Fier is a 32 y.o. male who presents with: left subclavian DVT possibly due to extrinsic compression in the thoracic outlet.  Pt has already undergone rheolytic thrombectomy and is also undergoing thrombolysis with in-situ infusion of TNK, performed by IR.    Heparin is also being run to maintain anticoagulation of the sheath.  Hopefully resolution of the subclavian DVT will occur.    Patient should then be loaded on anticoagulation to help keep the left subclavian vein open.  Additional elements of the TOS work-up include chest xray to look for a cervical rib or any bony callus that could act as a compression point on the left subclavian vein.   Chest MRI to evaluate the anatomy in the thoracic outlet may be needed, but I would probably defer those additional studies to a tertiary center familiar with the work-up of this particular disease entity to avoid duplication of services.    This patient would likely benefit from referral as soon as possible to [facility] Dr. Denyse Dago (Thoracic) at [facility] for treatment of the extrinsic compression and possible venous reconstruction if needed.    The  patient is to undergo catheter check tomorrow to determine if the DVT has resolved.   Under no circumstances should the vein be stented prior to relieving any extrinsic compression.    Unfortunately, there are no vascular surgeons in the [place] area that perform TOS decompression procedures, so tertiary referral to [facility] will be likely needed.  Leonides Sake, MD [facility] [place] Office: [phone] Pager: [phone]  08/22/2012, 2:33 PM

## 2012-08-22 NOTE — Progress Notes (Signed)
Pharmacy Consult-Anticoagulation  Pharmacy Consult:  32 y/o male currently on Heparin infusion for L-axillary vein thrombosis.  TNKase infusion started in Interventional radiology for thrombolysis.   Current Labs: Hematology  Recent Labs  08/19/12 1827 08/20/12 0950 08/20/12 1500 08/21/12 0625 08/22/12 0620  HGB 16.8 15.2  --  14.8 15.4  HCT 46.5 43.4  --  41.8 43.1  PLT 214 199  --  204 216  APTT 30  --   --   --   --   LABPROT 13.1  --   --   --  13.1  INR 1.00  --   --   --  1.00  HEPARINUNFRC  --  0.54 0.63 0.63 0.62  CREATININE  --   --   --  1.09 1.28   Lab Results  Component Value Date   INR 1.00 08/22/2012   INR 1.00 08/19/2012    Estimated Creatinine Clearance: 80.4 ml/min (by C-G formula based on Cr of 1.28).  Current Meds: . heparin 1,150 Units/hr (08/21/12 1212)  . tenecteplase (TNKase) infusion  0.25 mg/hour [NEW]    Assessment:  Heparin infusing at 1150 units/hr.  Heparin level 0.62 units/ml.  CT scan suspicious for thoracic outlet obstruction.  TNKase infusion started for thrombolysis.   Goal/Plan:  Heparin goal in combination with lytic therapy is 0.2 - 0.5 units/ml..  Heparin will be reduced to 850 units/hr.  Next Heparin level due 6 hours after Heparin rate reduction.  Daily Heparin level and CBC while on Heparin.  Tasmia Blumer, Elisha Headland, Pharm.D. 08/22/2012  9:15 AM

## 2012-08-22 NOTE — Progress Notes (Signed)
UR Completed.  Trevor Spence Jane [phone] 08/22/2012  

## 2012-08-22 NOTE — Progress Notes (Signed)
Notified Dr. Grace Isaac of Pt first void post procedure and coloration of dark red.  He reassured me this was expected r/t the the type of procedure done today and to continue with the ordered IV fluids and PO clear liquid diet at this time.    Jacqulyn Cane, RN

## 2012-08-22 NOTE — Procedures (Signed)
Left upper extremity venogram confirms diagnosis of thoracic outlet syndrome. Successful traversal of short segment occlusion of the left subclavian vein. AngioJet resulted in restoration of antegrade flow through the occlusion but with persistent narrowing and mural thrombus. Infusion catheter placed for overnight TNK administration. No immediate complications.

## 2012-08-22 NOTE — Progress Notes (Signed)
ANTICOAGULATION CONSULT NOTE - Follow Up Consult  Pharmacy Consult for Heparin  Indication: UE DVT s/p thrombectomy/ongoing thrombolysis  No Known Allergies  Patient Measurements: Height: 5\' 10"  (177.8 cm) Weight: 154 lb 1.6 oz (69.9 kg) IBW/kg (Calculated) : 73 Heparin Dosing Weight: 69.9kg  Vital Signs: Temp: 98.3 F (36.8 C) (05/15 1600) Temp src: Oral (05/15 1600) BP: 125/78 mmHg (05/15 1830) Pulse Rate: 60 (05/15 1835)  Labs:  Recent Labs  08/21/12 0625 08/22/12 0620 08/22/12 1615  HGB 14.8 15.4 14.3  HCT 41.8 43.1 39.7  PLT 204 216 181  LABPROT  --  13.1  --   INR  --  1.00  --   HEPARINUNFRC 0.63 0.62 0.11*  CREATININE 1.09 1.28  --     Estimated Creatinine Clearance: 80.4 ml/min (by C-G formula based on Cr of 1.28).   Medications:  Heparin 800 units/hr  Assessment: 32yom continues on heparin for UE DVT s/p thrombectomy with ongoing tenecteplase for thrombolysis. Heparin rate was decreased post-procedure (~1300) to 800 units/hr (was on heparin 1150 units/hr with heparin levels ~0.6 prior to and during the procedure). Heparin level (0.11) was drawn early (~1615) but will increase heparin rate based on subtherapeutic level. Heparin level goal will be reduced due to concomitant tenecteplase infusion. - Hg stable, Plts trending down - RN notified MD of blood-tinged urine (expected per MD notes), no other significant bleeding reported  Goal of Therapy:  Heparin level 0.2-0.5 units/ml Monitor platelets by anticoagulation protocol: Yes   Plan:  1. Increase heparin drip to 950 units/hr (9.5 ml/hr) 2. Check heparin level 6 hours after rate increase 3. Monitor for s/sx of bleeding and follow-up IR plans  Cleon Dew [phone] 08/22/2012,7:08 PM

## 2012-08-22 NOTE — Progress Notes (Signed)
Subjective: No complaints  Objective: Vital signs in last 24 hours: Temp:  [97.6 F (36.4 C)-98.4 F (36.9 C)] 98.4 F (36.9 C) (05/15 0505) Pulse Rate:  [62-78] 62 (05/15 0505) Resp:  [16-20] 20 (05/15 0505) BP: (108-116)/(65-78) 113/78 mmHg (05/15 0505) SpO2:  [98 %] 98 % (05/15 0505) Weight change:  Last BM Date: 08/21/12 Intake/Output from previous day:   Intake/Output this shift:    PE: General:Pleasant affect, NAD Skin:Warm and dry, brisk capillary refill Heart:S1S2 RRR without murmur, gallup, rub or click Lungs:clear without rales, rhonchi, or wheezes WUJ:WJXB, non tender, + BS, do not palpate liver spleen or masses Ext:no lower ext edema, 2+ pedal pulses, 2+ radial pulses, Lt upper arm swollen  Lab Results:  Recent Labs  08/21/12 0625 08/22/12 0620  WBC 9.3 10.1  HGB 14.8 15.4  HCT 41.8 43.1  PLT 204 216   BMET  Recent Labs  08/21/12 0625 08/22/12 0620  NA 140 140  K 3.3* 3.8  CL 105 104  CO2 28 24  GLUCOSE 96 108*  BUN 12 15  CREATININE 1.09 1.28  CALCIUM 9.5 9.5   No results found for this basename: TROPONINI, CK, MB,  in the last 72 hours  No results found for this basename: CHOL, HDL, LDLCALC, LDLDIRECT, TRIG, CHOLHDL   No results found for this basename: HGBA1C     Lab Results  Component Value Date   TSH 3.163 08/21/2012    Hepatic Function Panel  Recent Labs  08/21/12 0625  PROT 6.8  ALBUMIN 3.6  AST 15  ALT 25  ALKPHOS 50  BILITOT 0.5    Studies/Results: No results found.  Medications: I have reviewed the patient's current medications. Scheduled Meds:  Continuous Infusions: . heparin 1,150 Units/hr (08/21/12 1212)   PRN Meds:.  Assessment/Plan: Principal Problem:   DVT of left axillary vein, acute, and Lt subclavian vein Active Problems:   Edema of upper extremity  Plan: for venogram and lysis today.  Will ask vascular service to see.   LOS: 3 days   Time spent with pt. :15 minutes. [facility] R  Nurse  Practitioner Certified Pager [phone] 08/22/2012, 9:06 AM   Agree with note written by Nada Boozer RNP  For Venogram and possible thrombectomy/thrombolysis today by VIR. No change in clinical status.  Runell Gess 08/22/2012 3:36 PM

## 2012-08-22 NOTE — Progress Notes (Signed)
[facility] [place]  Full c/s to follow.  After reviewing the CT, I would agree that the pattern of occlusion in a 32 year old is suspicious for thoracic outlet syndrome.  Most recent TOS literature advocates considering intervention on underlying thoracic outlet pathology shortly after intervention on the subclavian with thrombolytic techniques.  No vascular surgeons in the [place] perform thoracic outlet decompression procedures.  I have been referring all of these case to Dr. Denyse Dago (Thoracic) at [facility].  Leonides Sake, MD [facility] [place] Office: [phone] Pager: [phone]  08/22/2012, 10:14 AM

## 2012-08-23 ENCOUNTER — Inpatient Hospital Stay (HOSPITAL_COMMUNITY): Payer: BC Managed Care – PPO

## 2012-08-23 HISTORY — PX: THROMBOLYSIS: SHX2508

## 2012-08-23 LAB — BASIC METABOLIC PANEL
BUN: 14 mg/dL (ref 6–23)
Chloride: 104 mEq/L (ref 96–112)
GFR calc Af Amer: 78 mL/min — ABNORMAL LOW (ref >90)
Glucose, Bld: 101 mg/dL — ABNORMAL HIGH (ref 70–99)
Potassium: 3.6 mEq/L (ref 3.5–5.1)

## 2012-08-23 LAB — PROTEIN S, TOTAL: Protein S Ag, Total: 83 % (ref 60–150)

## 2012-08-23 LAB — CBC
HCT: 40.6 % (ref 39.0–52.0)
Hemoglobin: 14.4 g/dL (ref 13.0–17.0)
RDW: 12.4 % (ref 11.5–15.5)
WBC: 11.4 10*3/uL — ABNORMAL HIGH (ref 4.0–10.5)

## 2012-08-23 LAB — FIBRINOGEN: Fibrinogen: 309 mg/dL (ref 204–475)

## 2012-08-23 MED ORDER — RIVAROXABAN 15 MG PO TABS
15.0000 mg | ORAL_TABLET | Freq: Two times a day (BID) | ORAL | Status: DC
  Administered 2012-08-23 – 2012-08-24 (×2): 15 mg via ORAL
  Filled 2012-08-23 (×4): qty 1

## 2012-08-23 MED ORDER — HEPARIN (PORCINE) IN NACL 100-0.45 UNIT/ML-% IJ SOLN
950.0000 [IU]/h | INTRAMUSCULAR | Status: DC
  Administered 2012-08-23: 950 [IU]/h via INTRAVENOUS
  Filled 2012-08-23 (×2): qty 250

## 2012-08-23 MED ORDER — WHITE PETROLATUM GEL
Status: AC
  Filled 2012-08-23: qty 5

## 2012-08-23 MED ORDER — IOHEXOL 300 MG/ML  SOLN
100.0000 mL | Freq: Once | INTRAMUSCULAR | Status: AC | PRN
  Administered 2012-08-23: 20 mL via INTRAVENOUS

## 2012-08-23 MED ORDER — HEPARIN (PORCINE) IN NACL 100-0.45 UNIT/ML-% IJ SOLN
950.0000 [IU]/h | INTRAMUSCULAR | Status: DC
  Filled 2012-08-23: qty 250

## 2012-08-23 MED ORDER — RIVAROXABAN 15 MG PO TABS
15.0000 mg | ORAL_TABLET | Freq: Two times a day (BID) | ORAL | Status: DC
  Filled 2012-08-23 (×2): qty 1

## 2012-08-23 MED ORDER — APIXABAN 5 MG PO TABS: 5.0000 mg | ORAL_TABLET | Freq: Two times a day (BID) | ORAL | Status: DC

## 2012-08-23 MED ORDER — MIDAZOLAM HCL 2 MG/2ML IJ SOLN
INTRAMUSCULAR | Status: DC | PRN
Start: 2012-08-23 — End: 2012-08-23
  Administered 2012-08-23: 1 mg via INTRAVENOUS

## 2012-08-23 MED ORDER — FENTANYL CITRATE 0.05 MG/ML IJ SOLN
INTRAMUSCULAR | Status: DC | PRN
  Administered 2012-08-23: 50 ug via INTRAVENOUS

## 2012-08-23 MED ORDER — APIXABAN 5 MG PO TABS
10.0000 mg | ORAL_TABLET | Freq: Two times a day (BID) | ORAL | Status: DC
  Filled 2012-08-23 (×2): qty 2

## 2012-08-23 MED ORDER — RIVAROXABAN 20 MG PO TABS: 20.0000 mg | ORAL_TABLET | Freq: Every day | ORAL | Status: DC

## 2012-08-23 NOTE — ED Notes (Signed)
First sedation medication at 903

## 2012-08-23 NOTE — Progress Notes (Addendum)
Addendum: Eliquis is NOT FDA approved for DVT treatment as of yet. Treatment for this patient has been changed to Xarelto at the request of Dr. Bryan Lemma after discussion with Epifania Gore, PharmD   Thanks, Franchot Erichsen, Pharm.D. Clinical Pharmacist   Pager: [phone] 08/23/2012 5:18 PM

## 2012-08-23 NOTE — Progress Notes (Signed)
[facility] [place]  Daily Progress Note  Assessment/Planning: POD #1 s/p Rheolytic thrombectomy, Thrombolysis (IR)   Catheter recheck today to see if SCV DVT resolved  Further evaluation once sheath out of L arm  Subjective    No events overnight  Objective Filed Vitals:   08/23/12 0200 08/23/12 0300 08/23/12 0400 08/23/12 0500  BP: 127/57 142/65 126/57 132/60  Pulse: 63 67 62 72  Temp:   98.1 F (36.7 C)   TempSrc:   Oral   Resp: 15 15 14 19   Height:      Weight:      SpO2: 97% 98% 97% 98%    Intake/Output Summary (Last 24 hours) at 08/23/12 0701 Last data filed at 08/23/12 0500  Gross per 24 hour  Intake 674.29 ml  Output    975 ml  Net -300.71 ml   GEN No obvious signs of bleeding PULM  CTAB CV  RRR GI  soft, NTND VASC  L upper arm with slightly less prominent veins and softer upper arm  Laboratory CBC    Component Value Date/Time   WBC 11.4* 08/23/2012 0130   HGB 14.4 08/23/2012 0130   HCT 40.6 08/23/2012 0130   PLT 187 08/23/2012 0130    BMET    Component Value Date/Time   NA 138 08/23/2012 0130   K 3.6 08/23/2012 0130   CL 104 08/23/2012 0130   CO2 26 08/23/2012 0130   GLUCOSE 101* 08/23/2012 0130   BUN 14 08/23/2012 0130   CREATININE 1.35 08/23/2012 0130   CALCIUM 9.1 08/23/2012 0130   GFRNONAA 67* 08/23/2012 0130   GFRAA 78* 08/23/2012 0130    Leonides Sake, MD [facility] [place] Office: [phone] Pager: [phone]  08/23/2012, 7:01 AM

## 2012-08-23 NOTE — Procedures (Signed)
LUE venous lysis check, Venous PTA 10 mm Find - Patency achieved with some residual narrowing. Plan - Stop lysis. Anticoag. Comp - None

## 2012-08-23 NOTE — Progress Notes (Signed)
Patient ID: Trevor Spence, male   DOB: 06-01-1980, 32 y.o.   MRN: [id number] The patient is now s/p success lysis of LUE DVT. He has an adequate result with antegrade flow and some residual narrowing secondary to TOS. Recommendations are to begin Coumadin therapy and consider L first rib resection. Also consider graded compression stocking until edema fully resolves.

## 2012-08-23 NOTE — Progress Notes (Addendum)
ANTICOAGULATION CONSULT NOTE - Follow Up Consult  Pharmacy Consult for Heparin  Indication: UE DVT s/p thrombectomy/ongoing thrombolysis  No Known Allergies  Patient Measurements: Height: 5\' 10"  (177.8 cm) Weight: 154 lb 1.6 oz (69.9 kg) IBW/kg (Calculated) : 73 Heparin Dosing Weight: 69.9kg  Vital Signs: Temp: 98.2 F (36.8 C) (05/16 0755) Temp src: Oral (05/16 0755) BP: 173/95 mmHg (05/16 1230) Pulse Rate: 84 (05/16 1230)  Labs:  Recent Labs  08/21/12 0625 08/22/12 0620 08/22/12 1615 08/23/12 0130 08/23/12 1000  HGB 14.8 15.4 14.3 14.4  --   HCT 41.8 43.1 39.7 40.6  --   PLT 204 216 181 187  --   LABPROT  --  13.1  --   --   --   INR  --  1.00  --   --   --   HEPARINUNFRC 0.63 0.62 0.11* 0.22* <0.10*  CREATININE 1.09 1.28  --  1.35  --     Estimated Creatinine Clearance: 76.2 ml/min (by C-G formula based on Cr of 1.35).   Medications:  Heparin 950 units/hr  Assessment: 32 yom continues on heparin for UE DVT s/p thrombectomy with tenecteplase for thrombolysis. Heparin level (<0.1) was drawn after patient returned from IR, and heparin infusion was off at the time lab collected.  CBC, platelet count stable.  Per RN report, heparin and TNK currently off, but heparin to resume 1 hr after procedure.  No bleeding reported per RN, blood-tinged urine resolved.  Goal of Therapy:  Heparin level 0.2-0.5 units/ml Monitor platelets by anticoagulation protocol: Yes   Plan:  1. Resume IV heparin at 950 units/hr.  2. Heparin level in 6 hours to confirm dosing.  3. Continue daily heparin level and CBC. 4. Start oral anticoagulation soon?  Tad Moore, BCPS  Clinical Pharmacist Pager [phone]  08/23/2012 1:02 PM

## 2012-08-23 NOTE — ED Notes (Signed)
Day #2 start time and date is 08/23/12 at 8:45

## 2012-08-23 NOTE — Progress Notes (Addendum)
[facility]  Subjective: No complaints  Objective: Vital signs in last 24 hours: Temp:  [97.8 F (36.6 C)-98.6 F (37 C)] 98.2 F (36.8 C) (05/16 0755) Pulse Rate:  [51-83] 70 (05/16 1000) Resp:  [0-24] 17 (05/16 1000) BP: (102-155)/(52-113) 120/52 mmHg (05/16 1000) SpO2:  [97 %-100 %] 98 % (05/16 1000) Last BM Date: 08/21/12  Intake/Output from previous day: 05/15 0701 - 05/16 0700 In: 713.8 [I.V.:345; IV Piggyback:368.8] Out: 1325 [Urine:1325] Intake/Output this shift: Total I/O In: -  Out: 400 [Urine:400]  Medications Current Facility-Administered Medications  Medication Dose Route Frequency Provider Last Rate Last Dose  . 0.9 %  sodium chloride infusion  250 mL Intravenous PRN Simonne Come, MD 10 mL/hr at 08/22/12 1300 250 mL at 08/22/12 1300  . fentaNYL (SUBLIMAZE) injection   Intravenous PRN Art A Hoss, MD   50 mcg at 08/23/12 0906  . heparin ADULT infusion 100 units/mL (25000 units/250 mL)  950 Units/hr Intravenous Continuous Jessica C Carney, RPH      . midazolam (VERSED) injection   Intravenous PRN Art A Hoss, MD   1 mg at 08/23/12 0906  . ondansetron (ZOFRAN) injection 4 mg  4 mg Intravenous Q6H PRN Simonne Come, MD   4 mg at 08/22/12 1354  . sodium chloride 0.9 % injection 3 mL  3 mL Intravenous Q12H Simonne Come, MD   3 mL at 08/22/12 2220  . sodium chloride 0.9 % injection 3 mL  3 mL Intravenous PRN Simonne Come, MD      . tenecteplase (TNKASE) 10 mcg/mL in sodium chloride 0.9 % 250 mL infusion  0.2 mg/hr Intracatheter Continuous Simonne Come, MD   0.2 mg/hr at 08/22/12 2308    PE: General appearance: alert, cooperative and no distress Lungs: clear to auscultation bilaterally Heart: regular rate and rhythm, S1, S2 normal, no murmur, click, rub or gallop Extremities: No LEE. 1+ left UEE Pulses: 2+ and symmetric Neurologic: Grossly normal  Lab Results:   Recent Labs  08/22/12 0620 08/22/12 1615 08/23/12 0130  WBC 10.1 9.8 11.4*   HGB 15.4 14.3 14.4  HCT 43.1 39.7 40.6  PLT 216 181 187   BMET  Recent Labs  08/21/12 0625 08/22/12 0620 08/23/12 0130  NA 140 140 138  K 3.3* 3.8 3.6  CL 105 104 104  CO2 28 24 26   GLUCOSE 96 108* 101*  BUN 12 15 14   CREATININE 1.09 1.28 1.35  CALCIUM 9.5 9.5 9.1   PT/INR  Recent Labs  08/22/12 0620  LABPROT 13.1  INR 1.00    Assessment/Plan    Principal Problem:   DVT of left axillary vein, acute, and Lt subclavian vein Active Problems:   Edema of upper extremity  Plan:  POD #1 SP Rheolytic thrombectomy, Thrombolysis of LUE DVT by IR.  Dr. Bonnielee Haff recommended starting coumadin. Currently on heparin.  ? If Xarelto would be a good alternative with less follow-up required.  Will consult case mgr to check on cost.  Will need referral to [facility] for consideration of L first rib resection.  Will transfer to floor.  ? DC timeframe.    LOS: 4 days    HAGER, BRYAN 08/23/2012 11:25 AM  I have seen and evaluated the patient this PM along with [name], PA. I agree with his findings, examination as well as impression recommendations.  Very pleasant 32 y/o man with TOS s/p thrombolysis of LSCV & Axillary thrombosis. I agree with converting to NOAC - but  would prefer Eliquis at PR treatment dose (5 mg BID therapy to avoid potential lapse).  Would like to have 3 doses taken before d/c. Can transfer to tele. Will order compression sleeve per IR recommendations. (appreciate IR & Vasc Sgx input). Needs OP Consult to [facility] -Dr. Denyse Dago (Thoracic Sgx).  Will need to set make this contact on Monday AM.  He will need f/u @[facility]  in 1-2 weeks to ensure smooth transfer of data & consult with Dr. Ewing Schlein.  Anticipate probably read for d/c by tomorrow PM after 3rd dose.  Marykay Lex, M.D., M.S. [facility] [address] [place], [place]  [address]  [phone] Pager # [phone] 08/23/2012 4:57 PM  Correction on Eliquis dosing --  per pharmacy: dose for indication of DVT is 10mg  po BID x 7 days followed by 5mg  po BID.  Marykay Lex, M.D., M.S. [facility] [address] [place], [place]  [address]  [phone] Pager # [phone] 08/23/2012 5:26 PM  Actually - confirmed with Pharmacy -- Eliquis not yet FDA approved for VTE Treatment --> will use Xarelto at VTE Rx dose (15 mg bid x 21 days then 20mg  daily).  Marykay Lex, MD 08/23/2012 6:24 PM

## 2012-08-23 NOTE — Progress Notes (Signed)
Chaplain Note: Chaplain visited with pt and pt's family.  Pt was resting in bed, awake and alert.  There was a visitor at bedside in support of the pt.  Chaplain provided spiritual comfort and support for pt and pt's visitor.  Pt reported that he was feeling better today.  Pt expressed appreciation for chaplain support.  Chaplain will follow up as needed.  08/23/12 1100  Clinical Encounter Type  Visited With Patient and family together  Visit Type Spiritual support  Referral From Chaplain  Spiritual Encounters  Spiritual Needs Emotional  Stress Factors  Patient Stress Factors Health changes  Family Stress Factors Major life changes  Verdie Shire, Chaplain [phone]

## 2012-08-23 NOTE — Progress Notes (Signed)
ANTICOAGULATION CONSULT NOTE - Follow Up Consult  Pharmacy Consult for Heparin  Indication: UE DVT s/p thrombectomy/ongoing thrombolysis  No Known Allergies  Patient Measurements: Height: 5\' 10"  (177.8 cm) Weight: 154 lb 1.6 oz (69.9 kg) IBW/kg (Calculated) : 73 Heparin Dosing Weight: 69.9kg  Vital Signs: Temp: 98.1 F (36.7 C) (05/16 0400) Temp src: Oral (05/16 0400) BP: 125/78 mmHg (05/15 1830) Pulse Rate: 60 (05/15 1835)  Labs:  Recent Labs  08/21/12 0625 08/22/12 0620 08/22/12 1615 08/23/12 0130  HGB 14.8 15.4 14.3 14.4  HCT 41.8 43.1 39.7 40.6  PLT 204 216 181 187  LABPROT  --  13.1  --   --   INR  --  1.00  --   --   HEPARINUNFRC 0.63 0.62 0.11* 0.22*  CREATININE 1.09 1.28  --   --     Estimated Creatinine Clearance: 80.4 ml/min (by C-G formula based on Cr of 1.28).   Medications:  Heparin 950 units/hr  Assessment: 32yom continues on heparin for UE DVT s/p thrombectomy with ongoing tenecteplase for thrombolysis. Heparin level (0.22) is at-goal on 950 units/hr. RN reports some blood-tinged urine (expected per MD notes).  Goal of Therapy:  Heparin level 0.2-0.5 units/ml Monitor platelets by anticoagulation protocol: Yes   Plan:  1. Continue IV heparin at 950 units/hr.  2. Heparin level in 6 hours to confirm dosing.   Emeline Gins 08/23/2012,4:45 AM

## 2012-08-23 NOTE — Progress Notes (Signed)
Received patient from 2100 with heparin drip at 950 units /hr. Xarelto given  And heparin drip discontinued per pharmacy . Patient is alert and oriented, not in any distress , dressing to left upper arm intact, non pitting edema to left upper noted.

## 2012-08-24 DIAGNOSIS — I82A19 Acute embolism and thrombosis of unspecified axillary vein: Secondary | ICD-10-CM

## 2012-08-24 LAB — CBC
HCT: 40.2 % (ref 39.0–52.0)
Hemoglobin: 14.2 g/dL (ref 13.0–17.0)
MCV: 84.5 fL (ref 78.0–100.0)
Platelets: 191 10*3/uL (ref 150–400)
RBC: 4.76 MIL/uL (ref 4.22–5.81)
WBC: 8.5 10*3/uL (ref 4.0–10.5)

## 2012-08-24 MED ORDER — RIVAROXABAN 20 MG PO TABS: 20.0000 mg | ORAL_TABLET | Freq: Every day | ORAL | Status: DC

## 2012-08-24 MED ORDER — TRUFORM ARM SLEEVE M 15-20MMHG MISC: 1.0000 "application " | Freq: Every day | Status: DC

## 2012-08-24 MED ORDER — RIVAROXABAN 15 MG PO TABS: 15.0000 mg | ORAL_TABLET | Freq: Two times a day (BID) | ORAL | Status: DC

## 2012-08-24 NOTE — Progress Notes (Signed)
Subjective: Pt feels pretty good. Reports some recurrent swelling when he was in shower and using his arms. Swelling subsided when he elevated his arm again. Denies pain  Objective: Physical Exam: BP 117/66  Pulse 71  Temp(Src) 97.8 F (36.6 C) (Oral)  Resp 18  Ht 5\' 10"  (1.778 m)  Wt 154 lb 1.6 oz (69.9 kg)  BMI 22.11 kg/m2  SpO2 99% (L)UE with 1-2+ edema from elbow proximal to shoulder. Venipuncture site is soft, NT, no hematoma Hand warm, Neurovascular intact   Labs: CBC  Recent Labs  08/23/12 0130 08/24/12 0535  WBC 11.4* 8.5  HGB 14.4 14.2  HCT 40.6 40.2  PLT 187 191   BMET  Recent Labs  08/22/12 0620 08/23/12 0130  NA 140 138  K 3.8 3.6  CL 104 104  CO2 24 26  GLUCOSE 108* 101*  BUN 15 14  CREATININE 1.28 1.35  CALCIUM 9.5 9.1   LFT No results found for this basename: PROT, ALBUMIN, AST, ALT, ALKPHOS, BILITOT, BILIDIR, IBILI, LIPASE,  in the last 72 hours PT/INR  Recent Labs  08/22/12 0620  LABPROT 13.1  INR 1.00     Studies/Results: Ir Pta Venous Left  08/23/2012   *RADIOLOGY REPORT*  Clinical Data/Indication: 24 HOURS POST VENOUS LYSIS.  VENOUS LYSIS CHECK ,PTA VENOUS  Sedation: Versed one mg, Fentanyl 15 mg.  Total Moderate Sedation Time: 10 minutes.  Contrast Volume: 20 ml Omnipaque-300.  Fluoroscopy Time: 1 minute and 36 seconds.  Procedure: The procedure, risks, benefits, and alternatives were explained to the patient. Questions regarding the procedure were encouraged and answered. The patient understands and consents to the procedure.  The left arm was prepped with betadine in a sterile fashion, and a sterile drape was applied covering the operative field. A sterile gown and sterile gloves were used for the procedure.  Left arm venography was performed through the left basilic vein sheath.  The infusion catheter was removed over a Benson wire.  The subclavian vein was dilated to 10 mm.  Postangioplasty imaging was performed.  The wire and  sheath were removed.  Hemostasis was achieved direct pressure.  Findings: There is been improvement within the thrombus in the left subclavian vein.  The collateral venous structures persist.  There is some irregularity within the left subclavian vein.  After dilatation to 10 mm, there was approximately 50% residual narrowing and some irregularity of the vein likely related to chronic thrombus and extrinsic compression.  Complications: None.  IMPRESSION: Successful left subclavian vein thrombosis with some residual narrowing likely related to chronic thoracic outlet syndrome within the left subclavian vein.  Heparin will be resumed and eight transition to Coumadin will occur.  The patient will also be recommended for potential surgical correction of thoracic outlet syndrome.   Original Report Authenticated By: Jolaine Click, M.D.   Ir Infusion Thrombol Venous Initial (ms)  08/22/2012   *RADIOLOGY REPORT*  Indication: Symptomatic left upper extremity DVT.  The patient reports a history of left arm swelling beginning approximately 2-3 weeks ago.  The left arm swelling acutely worsened approximately 1 week ago ultimately resulting in evaluation with chest CT (performed 08/19/2012) which demonstrated thrombus within the central left axillary and left subclavian veins concerning for thoracic outlet syndrome.  1.  LEFT UPPER EXTERMITY VENOGRAM 2.  MECHANICAL THROMBECTOMY WITH USE OF THE ANGIO JET DEVICE 3.  PLACEMENT OF INFUSION CATHETER 4.  ULTRASOUND GUIDANCE FOR VASCULAR ACCESS  Comparisons: Chest CT - 08/19/2012  Intravenous Medications: Fentanyl 75 mcg  IV; Versed 2 mg IV  Contrast: 60 ml Omnipaque-300  Total Moderate Sedation Time: 40 minutes  Fluoroscopy Time: 7 minutes; 6 seconds  Complications: None immediate  Technique:  Informed written consent was obtained from the patient after a discussion of the risks, benefits and alternatives to treatment. Questions regarding the procedure were encouraged and answered.  A  timeout was performed prior to the initiation of the procedure.  The patient was placed supine on the fluoroscopy table. Preprocedural ultrasound scanning demonstrates wide patency of the brachial and basilic veins within the mid and distal aspect of the left humerus.  The skin overlying the medial distal aspect of the left humerus was prepped and draped in the usual sterile fashion, and a sterile drape was applied covering the operative field. Maximum barrier sterile technique with sterile gowns and gloves were used for the procedure.  Under direct ultrasound guidance, the basilic vein was access with a micropuncture kit just central to the medial epicondyle after the overlying soft tissues were anesthetized with 1% lidocaine.  An ultrasound image was saved for documentation purposed.  A left upper extremity venogram was performed in multiple stations to the chest via the use of the inner stiffener of the micropuncture kit. Ultimately, the micropuncture sheath was exchanged for a short 6- [place] vascular sheath over a guidewire.  A repeat venogram was performed through the side arm of the vascular sheath.  With the use of a regular glide wire, a Kumpe catheter was advanced through the moderate length segment occlusion within the mid aspect of the subclavian vein to the level of the SVC.  Limited venograms were performed from the SVC to confirm appropriate intraluminal positioning.  With the use of a Kumpe catheter, an exchange length Rosen wire was advanced to the level of the IVC.  Under intermittent fluoroscopic guidance, the 6-French vascular sheath was exchanged for a 7-French, 35 cm vascular sheath with radiopaque tip advanced to the level of the left axilla. Mechanical thrombectomy was performed with the use of the Angio- Seal device.  Post mechanical thrombectomy central venogram was performed.  A 10 cm/45-cm infusion catheter was advanced with sideports extending from the central aspect of the subclavian  occlusion to the peripheral aspect of the vascular sheath within the left axilla.  The vascular sheath was sutured in place with an interrupted suture.  A dressing was placed.  The patient tolerated the procedure well without immediate postprocedural complication.  The infusion catheter was connected to [facility] and the infusion was initiated.  Findings:  Initial ultrasound scanning demonstrates wide patency of the mid and distal humeral aspect of the basilic and brachial veins which was subsequently confirmed with venography.  Left upper extremity venogram demonstrates a moderate length occlusion of the left subclavian vein at the articulation of the clavicle and first rib.  There is a minimal amount of nonocclusive thrombus within the central aspect of the left axillary vein.  The subclavian vein occlusion was successfully traversed and Mechanical thrombectomy was performed with the use of the AngioJet device.  Post mechanical thrombectomy venogram demonstrates restoration of antegrade flow with persistent moderate to severe irregular narrowing of the previously occluded segment with nonocclusive mural thrombus.  Successful placement of an infusion catheter traversing the occluded segment of the subclavian and central axillary veins.  Impression:  1.  Findings compatible with thoracic outlet syndrome with moderate length occlusion of the subclavian vein regional to the confluence of the clavicle and first rib.  There is a minimal  amount of nonocclusive thrombus within the central aspect of the left axillary vein. 2.  Successful percutaneous traversal of the subclavian vein occlusion and restoration of antegrade flow with the use of the AngioJet device.  A moderate amount of residual nonocclusive mural thrombus is seen at the site of prior subclavian vein occlusion.  3.  Successful placement of an infusion catheter across the subclavian vein occlusion and initiation of TNKase infusion.  Plan:  The patient will be  transferred to ICU for overnight TNKase infusion and will return tomorrow for repeat venogram and possible intervention.   Original Report Authenticated By: Tacey Ruiz, MD   Ir Rande Lawman F/u Eval Art/ven Final Day (ms)  08/23/2012   *RADIOLOGY REPORT*  Clinical Data/Indication: 24 HOURS POST VENOUS LYSIS.  VENOUS LYSIS CHECK ,PTA VENOUS  Sedation: Versed one mg, Fentanyl 15 mg.  Total Moderate Sedation Time: 10 minutes.  Contrast Volume: 20 ml Omnipaque-300.  Fluoroscopy Time: 1 minute and 36 seconds.  Procedure: The procedure, risks, benefits, and alternatives were explained to the patient. Questions regarding the procedure were encouraged and answered. The patient understands and consents to the procedure.  The left arm was prepped with betadine in a sterile fashion, and a sterile drape was applied covering the operative field. A sterile gown and sterile gloves were used for the procedure.  Left arm venography was performed through the left basilic vein sheath.  The infusion catheter was removed over a Benson wire.  The subclavian vein was dilated to 10 mm.  Postangioplasty imaging was performed.  The wire and sheath were removed.  Hemostasis was achieved direct pressure.  Findings: There is been improvement within the thrombus in the left subclavian vein.  The collateral venous structures persist.  There is some irregularity within the left subclavian vein.  After dilatation to 10 mm, there was approximately 50% residual narrowing and some irregularity of the vein likely related to chronic thrombus and extrinsic compression.  Complications: None.  IMPRESSION: Successful left subclavian vein thrombosis with some residual narrowing likely related to chronic thoracic outlet syndrome within the left subclavian vein.  Heparin will be resumed and eight transition to Coumadin will occur.  The patient will also be recommended for potential surgical correction of thoracic outlet syndrome.   Original Report Authenticated  By: Jolaine Click, M.D.    Assessment/Plan: (L)UE DVT from TOS Successful thrombolysis treatment with antegrade flow, but known residual narrowing from TOA. On Xarelto and Hep has been stopped. Planning DC today with follow to [facility] specialist. Think he would benefit from UE compression sleeve in the interim.    LOS: 5 days    Brayton El PA-C 08/24/2012 8:31 AM

## 2012-08-24 NOTE — Progress Notes (Signed)
VASCULAR PROGRESS NOTE  SUBJECTIVE: C/O left arm swelling.   PHYSICAL EXAM: Filed Vitals:   08/23/12 1700 08/23/12 1850 08/23/12 2141 08/24/12 0544  BP: 134/61 133/72 121/63 117/66  Pulse: 80 86 71 71  Temp:  98.6 F (37 C) 98.4 F (36.9 C) 97.8 F (36.6 C)  TempSrc:  Oral Oral Oral  Resp: 20 18 16 18   Height:      Weight:      SpO2: 100% 98% 100% 99%   Moderate left arm swelling.   LABS: Lab Results  Component Value Date   WBC 8.5 08/24/2012   HGB 14.2 08/24/2012   HCT 40.2 08/24/2012   MCV 84.5 08/24/2012   PLT 191 08/24/2012   Lab Results  Component Value Date   CREATININE 1.35 08/23/2012   Lab Results  Component Value Date   INR 1.00 08/22/2012   CBG (last 3)  No results found for this basename: GLUCAP,  in the last 72 hours  Principal Problem:   DVT of left axillary vein, acute, and Lt subclavian vein Active Problems:   Edema of upper extremity  ASSESSMENT AND PLAN: 1. Left arm DVT secondary to TOS.  2. S/p successful lysis of LUE DVT. He had an adequate result with antegrade flow and some residual narrowing secondary to TOS.  3. Pt is on Xarelto and heparin has been stopped.  4. Dr. Imogene Burn has recommended referral to Dr. Denyse Dago at [facility] as he is the only surgeon currently doing first rib resection in this area.  5. I do not see a cervical rib on the scout film for his chest CT.  Cari Caraway Beeper: [phone] 08/24/2012

## 2012-08-24 NOTE — Progress Notes (Signed)
Pt to have compression sleeve for discharge home.  Called [facility] here in hospital and they do not carry.  [employer] and they had just closed.  Called Carolyne Fiscal with [facility] and he states that the pt. Can go to [facility] in [facility] on [facility] Monday at 0830 to be fitted for sleeve.  Called [place] for Rx and to verify Xarelto dosing.  She placed Rx for the compression sleeve.  Pt informed.

## 2012-08-24 NOTE — Progress Notes (Signed)
[facility]  Subjective: No complaints. Arm pain and swelling improved.  Objective: Vital signs in last 24 hours: Temp:  [97.8 F (36.6 C)-98.6 F (37 C)] 97.8 F (36.6 C) (05/17 0544) Pulse Rate:  [71-87] 71 (05/17 0544) Resp:  [14-20] 18 (05/17 0544) BP: (115-173)/(47-95) 117/66 mmHg (05/17 0544) SpO2:  [96 %-100 %] 99 % (05/17 0544) Last BM Date: 08/21/12  Intake/Output from previous day: 05/16 0701 - 05/17 0700 In: 210.6 [I.V.:168.2; IV Piggyback:42.3] Out: 400 [Urine:400] Intake/Output this shift:    Medications Current Facility-Administered Medications  Medication Dose Route Frequency Provider Last Rate Last Dose  . 0.9 %  sodium chloride infusion  250 mL Intravenous PRN Simonne Come, MD 10 mL/hr at 08/22/12 1300 250 mL at 08/22/12 1300  . ondansetron (ZOFRAN) injection 4 mg  4 mg Intravenous Q6H PRN Simonne Come, MD   4 mg at 08/22/12 1354  . Rivaroxaban (XARELTO) tablet TABS 15 mg  15 mg Oral BID WC Marykay Lex, MD   15 mg at 08/24/12 7829   Followed by  . [START ON 09/13/2012] Rivaroxaban (XARELTO) tablet 20 mg  20 mg Oral Q supper Marykay Lex, MD      . sodium chloride 0.9 % injection 3 mL  3 mL Intravenous Q12H Simonne Come, MD   3 mL at 08/24/12 0929  . sodium chloride 0.9 % injection 3 mL  3 mL Intravenous PRN Simonne Come, MD   3 mL at 08/24/12 5621    PE: General appearance: alert, cooperative and no distress Lungs: clear to auscultation bilaterally Heart: regular rate and rhythm Extremities: left upper extremity is free of pain and swelling and erythema. No LEE Pulses: 2+ and symmetric Skin: warm and dry Neurologic: Grossly normal  Lab Results:   Recent Labs  08/22/12 1615 08/23/12 0130 08/24/12 0535  WBC 9.8 11.4* 8.5  HGB 14.3 14.4 14.2  HCT 39.7 40.6 40.2  PLT 181 187 191   BMET  Recent Labs  08/22/12 0620 08/23/12 0130  NA 140 138  K 3.8 3.6  CL 104 104  CO2 24 26  GLUCOSE 108* 101*  BUN 15 14   CREATININE 1.28 1.35  CALCIUM 9.5 9.1   PT/INR  Recent Labs  08/22/12 0620  LABPROT 13.1  INR 1.00   Assessment/Plan  Principal Problem:   DVT of left axillary vein, acute, and Lt subclavian vein Active Problems:   Edema of upper extremity  Plan: TOS s/p thrombolysis of LSCV & Axillary thrombosis. Doing well. Swelling and pain decreased. Plan for discharge home today. Will discharge on Xarelto at VTE Rx dose (15 mg bid x 21 days then 20mg  daily). The patient will f/u at [facility] with Dr. Denyse Dago for evaluation of first rib resection. Will arrange f/u at [facility].     LOS: 5 days    Brittainy M. Sharol Harness, PA-C 08/24/2012 11:08 AM  I have seen and examined the patient along with Brittainy M. Sharol Harness, PA-C.  I have reviewed the chart, notes and new data.  I agree with PA's note.  Key new complaints: swelling and discomfort better, still noticeable if he leaves arm hanging down for more than a few minutes Key examination changes: no visible edema or distended veins  PLAN: DC home. Xarelto as outlined in Dr. Elissa Hefty note. Follow up at [facility] with TOS specialist.  Thurmon Fair, MD, [facility] [phone] 08/24/2012, 11:45 AM

## 2012-08-27 ENCOUNTER — Encounter: Payer: Self-pay | Admitting: Cardiology

## 2012-08-28 ENCOUNTER — Ambulatory Visit (INDEPENDENT_AMBULATORY_CARE_PROVIDER_SITE_OTHER): Payer: BC Managed Care – PPO | Admitting: Cardiology

## 2012-08-28 ENCOUNTER — Encounter: Payer: Self-pay | Admitting: Cardiology

## 2012-08-28 VITALS — BP 110/70 | HR 64 | Ht 69.0 in | Wt 157.1 lb

## 2012-08-28 DIAGNOSIS — R609 Edema, unspecified: Secondary | ICD-10-CM

## 2012-08-28 DIAGNOSIS — R600 Localized edema: Secondary | ICD-10-CM

## 2012-08-28 DIAGNOSIS — I82A12 Acute embolism and thrombosis of left axillary vein: Secondary | ICD-10-CM

## 2012-08-28 DIAGNOSIS — G540 Brachial plexus disorders: Secondary | ICD-10-CM

## 2012-08-28 DIAGNOSIS — I82A19 Acute embolism and thrombosis of unspecified axillary vein: Secondary | ICD-10-CM

## 2012-08-28 NOTE — Progress Notes (Signed)
[facility]  Patient ID: Trevor Spence, male   DOB: 06-Sep-1980, 32 y.o.   MRN: [id number]  Clinic Note: HPI: Trevor Spence is a 32 y.o. male with a PMH below who presents today for followup from his recent hospitalization for thoracic outlet syndrome. He was a minimal 12th of May after several weeks of increased swelling of his left arm. He had blue discoloration consistent with collateral revascularization of his arm. He had a Doppler ultrasound done at the referring physician's office that showed concern for a subclavian vein thrombosis. [place] and a CT venogram that confirmed left axillary and subclavian vein thrombosis as well as a concern for thoracic outlet syndrome. He underwent catheter directed, lysis with overnight thrombolytics with near resolution of the clot. He was discharged on Xarelto is now here for followup.  Interval History: Since his discharge his been relatively well the swelling is almost all gone. He is wearing a compression sleeve as directed, but does note that after a good part of the day the blue discoloration still there on the shoulder albeit much improved from before. He still feels a tightness and discomfort in the back of his shoulder/neck that the preceded the swelling. He denies any palpitations or sudden increases in heart rate or shortness of breath that would be concerning for PE.  He denies any chest pain or shortness of breath or exertion.  he denies any melena hematochezia hematuria or nosebleeds.   Cardiovascular ROS: negative Additional cardiac review of systems: Lightheadedness - no, dizziness -no, syncope/near-syncope - no; TIA/amaurosis fugax - no   No Known Allergies  Current Outpatient Prescriptions  Medication Sig Dispense Refill  . Elastic Bandages & Supports (TRUFORM ARM SLEEVE M 15-20MMHG) MISC 1 application by Does not apply route daily.  1 each  0  . fluocinonide cream (LIDEX) 0.05 %       .  predniSONE (DELTASONE) 5 MG tablet Take 10 mg by mouth See admin instructions.      . Rivaroxaban (XARELTO) 15 MG TABS tablet Take 1 tablet (15 mg total) by mouth 2 (two) times daily with a meal.  42 tablet  0  . [START ON 09/13/2012] Rivaroxaban (XARELTO) 20 MG TABS Take 1 tablet (20 mg total) by mouth daily with supper.  30 tablet  5   No current facility-administered medications for this visit.    Past Medical History  Diagnosis Date  . Injury of left shoulder   . DVT of left axillary vein, acute 08/20/2012    s/p IR - directed thrombolysis  . Thoracic outlet syndrome 08/20/2012    Venous obstruction    Past Surgical History  Procedure Laterality Date  . Deep neck lymph node biopsy / excision    . Thrombolysis Left 08/23/2012    L Subclavian - Axillary Vein, due to TOS    History   Social History  . Marital Status: Married    Spouse Name: N/A    Number of Children: N/A  . Years of Education: N/A   Occupational History  . 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 Smoking status: Former Smoker    Quit date: 04/10/2009  . Smokeless tobacco: Never Used     Comment: use smok when having a drink  . Alcohol Use: No  . Drug Use: No  . Sexually Active: Yes   Other Topics Concern  . Not on file  Social History Narrative   Currently working in [place] where he has an apartment but also has a house in [place].    ROS: A comprehensive Review of Systems - Negative except Pertinent positives noted above.  PHYSICAL EXAM BP 110/70  Pulse 64  Ht 5\' 9"  (1.753 m)  Wt 157 lb 1.6 oz (71.26 kg)  BMI 23.19 kg/m2 General appearance: alert, cooperative, appears stated age, no distress and Pleasant mood and affect Neck: no adenopathy, no carotid bruit, no JVD and supple, symmetrical, trachea midline Lungs: clear to auscultation bilaterally, normal percussion bilaterally and Nonlabored Heart: regular rate and rhythm, S1, S2 normal, no murmur,  click, rub or gallop Extremities: edema Mild left upper extremity edema with mild varicosities noted in the left shoulder but dramatically improved from his hospitalization. He has the compression sleeve in place. The level of edema is minimal besides in the shoulder.  Skin: Skin color, texture, turgor normal. No rashes or lesions or varicosities - shoulder(s) left  [id number] today: No  ASSESSMENT:  Overall, progression as expected status post thrombolysis of left subclavian vein/axillary vein thrombosis. Tolerating Xarelto without any complaints.  Patient Active Problem List   Diagnosis Date Noted  . Thoracic outlet syndrome - with left subclavian and axillary vein thrombosis status post thrombolysis 08/20/2012    Priority: High  . DVT of left axillary vein, acute, and Lt subclavian vein 08/20/2012    Priority: Medium  . Edema of upper extremity 08/19/2012    PLAN:  Follow up with me in one month. OK return to work tomorrow. Minimal restrictions noted in back to work note.   Marykay Lex, M.D., M.S. [facility] [address] [place], [place]  [address]  [phone] Pager # [phone] 08/28/2012 11:45 AM

## 2012-08-28 NOTE — Assessment & Plan Note (Signed)
Minimum six-month Xarelto therapy. Followup Dopplers as above.

## 2012-08-28 NOTE — Assessment & Plan Note (Signed)
Refer to Dr. Ewing Schlein At [facility] for possible decompression surgery. Followup Doppler ultrasound of Left Upper Extremity with arm both up and down to reassess for patency of the subclavian vein.  Continue Xarelto for a minimum of 6 months.

## 2012-08-28 NOTE — Assessment & Plan Note (Signed)
Continue to use compression sleeve until directed otherwise by Dr. Ewing Schlein.

## 2012-08-28 NOTE — Patient Instructions (Addendum)
I'm happy to see that your arm is getting better. I would anticipate it would take a few more weeks for her that blue discoloration of the leg. It is important to continue using the compression sleeve.  Anticipated to be on a blood thinner for least 6 months.  The plan  is to refer you to Dr. Ewing Schlein who is a Chief Strategy Officer at [facility]. Depending on how quickly with Midrin to see him, we may or may not check and ultrasound on your arm.   You should be fine to my to work with routine activities. I did recommend every half an hour so that you stand up and the arms around and legs around and if at all possible keep your arm roughly the level of your heart. Avoid raising her arm too far above your heart. Needs upper extremity  Duplex and see Dr Herbie Baltimore in 1 months  HARDING,DAVID W, MD

## 2012-08-29 NOTE — Discharge Summary (Signed)
Physician Discharge Summary  Patient ID: Trevor Spence MRN: [id number] DOB/AGE: [birth year] 32 y.o.  Admit date: 08/19/2012 Discharge date: 08/29/2012  Admission Diagnoses: DVT of left axillary vein and left subclavian vein  Discharge Diagnoses:  Principal Problem:   DVT of left axillary vein, acute, and Lt subclavian vein Active Problems:   Edema of upper extremity   Discharged Condition: stable  Hospital Course: The patient is a 32 y/o male, with no past medical problems, who presented to [facility] on 08/19/12 with a chief complaint of a swollen left arm, that had progressively increased in size, over the course of two weeks. He denied recent injury or trauma. He had no neurologic symptoms. An upper extremity DVT and Thoracic Outlet Syndrome were suspected. A CT of the chest was ordered, which showed a thrombus within the central left axillary vein and left subclavian vein. He was placed on IV heparin. Vascular surgery was consulted. He underwent catheter directed lysis with overnight thrombolytics with near resolution of the clot. He had significant improvement in pain and swelling. He was last seen and examined by Dr. Royann Shivers, who felt he was stable for discharge home. It was decided to have the patient continue oral anticoagulation. He was discharged home on Xarelto and instructed to take 15 mg BID x 21 days, followed by 20 mg daily. He was also prescribed a compression sleeve.  Vascular surgery recommended referral to Dr. Denyse Dago at [facility] for consideration for first rib resection. The patient will follow-up there. He will also follow-up at [facility]    Consults: vascular surgery  Significant Diagnostic Studies:   CT of the chest 08/19/12 *RADIOLOGY REPORT*  Clinical Data: Left upper extremity thrombus. Suspected thoracic  outlet syndrome.  CT CHEST WITH CONTRAST  Technique: Multidetector CT imaging of the chest was performed  following the standard protocol during bolus  administration of  intravenous contrast.  Contrast: 80mL OMNIPAQUE IOHEXOL 300 MG/ML SOLN  Comparison: None.  Findings: Injection of contrast was performed in the left upper  extremity. This demonstrates filling defects within the left  subclavian vein and central left axillary vein. The subclavian  vein is not well visualized near the thoracic inlet. Extensive  collateral venous channels noted in the left upper extremity and  upper chest/neck with drainage through the left internal jugular  vein.  Heart is normal size. Aorta is normal caliber. No mediastinal,  hilar, or axillary adenopathy. The Lungs are clear. No focal  airspace opacities or suspicious nodules. No effusions. Imaging  into the upper abdomen shows no acute findings. Tiny low density  area within the superior right hepatic lobe is too small to  characterize, most likely small cysts.  No acute bony abnormality.  IMPRESSION:  Thrombus seen within the central left axillary vein and left  subclavian vein. The left subclavian vein is difficult to  visualize near the thoracic inlet. Extensive collateral venous  channels in the left neck and chest with drainage through the left  internal jugular vein.  Original Report Authenticated By: Charlett Nose, M.D.   Treatments: See Hospital Course  Discharge Exam: Blood pressure 117/66, pulse 71, temperature 97.8 F (36.6 C), temperature source Oral, resp. rate 18, height 5\' 10"  (1.778 m), weight 154 lb 1.6 oz (69.9 kg), SpO2 99.00%.   Disposition: 01-Home or Self Care  Discharge Orders   Future Appointments Provider Department Dept Phone   09/09/2012 11:30 AM [facility] [address] [facility] [address] [phone]   09/23/2012 8:45 AM [facility]  Herbie Baltimore, MD [facility] Ginette Otto [phone]   Future Orders Complete By Expires     Diet - low sodium heart healthy  As directed     Increase activity slowly  As directed          Medication List    STOP taking these medications       aspirin 81 MG chewable tablet      TAKE these medications       predniSONE 5 MG tablet  Commonly known as:  DELTASONE  Take 10 mg by mouth See admin instructions.     Rivaroxaban 15 MG Tabs tablet  Commonly known as:  XARELTO  Take 1 tablet (15 mg total) by mouth 2 (two) times daily with a meal.     Rivaroxaban 20 MG Tabs  Commonly known as:  XARELTO  Take 1 tablet (20 mg total) by mouth daily with supper.  Start taking on:  09/13/2012     TRUFORM ARM SLEEVE M 15-20MMHG Misc  1 application by Does not apply route daily.           Follow-up Information   Follow up with [facility] (our office will call you to arrange a follow-up appointment)    Contact information:   [phone]      Follow up with D'AMICO,THOMAS, MD. (follow up with Dr. Ewing Schlein at [facility] information:   [address] [facility] [address] [phone]      TIME SPENT ON DISCHARGE, INCLUDING PHYSICIAN TIME: >30 MINUTES  Signed: Allayne Butcher, PA-C 08/29/2012, 10:18 AM

## 2012-09-05 ENCOUNTER — Telehealth: Payer: Self-pay | Admitting: Cardiology

## 2012-09-05 NOTE — Telephone Encounter (Signed)
Continue the medication.  If the pain continues or worsens, call back tomorrow.  We may check his HGB and LFTs.

## 2012-09-05 NOTE — Telephone Encounter (Signed)
Returned call.  Pt stated he is taking Xarelto and for a couple of days he has been feeling tenderness on the R side of his abdomen.  Stated no blood in his stool or urine.  Stated pain is very mild and described as a cramp.  LBM was "a couple of hours ago" and was normal per pt.  Pt also taking prednisone and has 3 pills left to take.  Stated he thinks that could possibly be the cause too.  Pt informed muscle spasms are a SE of Xarelto and that an Extender will be notified to review since pt was seen for blood clots and RN will call back.  Pt rated pain 1/10 on pain scale now.  Pt verbalized understanding and agreed w/ plan.

## 2012-09-05 NOTE — Telephone Encounter (Signed)
Trevor Spence states that he is feeling cramps on his lower right side and is taking blood thinner( Xarelto 15mg ) twice a day and want to know if he should stop taking them or what should he do?  Thanks

## 2012-09-06 NOTE — Telephone Encounter (Signed)
Call to pt and pt stated he skipped a dose last night.  Stated he took it this morning and hasn't had the pain anymore.  Pt informed per PA.  Pt verbalized understanding and agreed w/ plan.

## 2012-09-06 NOTE — Telephone Encounter (Signed)
Returned call. Left message to call back.

## 2012-09-09 ENCOUNTER — Ambulatory Visit (HOSPITAL_COMMUNITY)
Admission: RE | Admit: 2012-09-09 | Discharge: 2012-09-09 | Disposition: A | Discharge status: Home / Self Care | Payer: BC Managed Care – PPO | Source: Ambulatory Visit | Attending: Cardiovascular Disease | Admitting: Cardiovascular Disease

## 2012-09-09 DIAGNOSIS — I82A12 Acute embolism and thrombosis of left axillary vein: Secondary | ICD-10-CM

## 2012-09-09 DIAGNOSIS — I82A19 Acute embolism and thrombosis of unspecified axillary vein: Secondary | ICD-10-CM | POA: Insufficient documentation

## 2012-09-09 DIAGNOSIS — I82B19 Acute embolism and thrombosis of unspecified subclavian vein: Secondary | ICD-10-CM | POA: Insufficient documentation

## 2012-09-09 DIAGNOSIS — I808 Phlebitis and thrombophlebitis of other sites: Secondary | ICD-10-CM

## 2012-09-09 DIAGNOSIS — G540 Brachial plexus disorders: Secondary | ICD-10-CM

## 2012-09-09 NOTE — Progress Notes (Signed)
Left Upper Ext. Venous Duplex Completed. Preliminary report by tech: DVT is still present in the left subclavian vein and axillary; and also thrombus in the basilic vein. Marilynne Halsted, RDMS, RVT

## 2012-09-18 ENCOUNTER — Telehealth: Payer: Self-pay | Admitting: *Deleted

## 2012-09-18 NOTE — Telephone Encounter (Signed)
Left message on voicemail to callback. Concerning doppler.

## 2012-09-18 NOTE — Telephone Encounter (Signed)
Faxed records to dr. Ewing Schlein at [facility] on 08/29/12. Their office will contact patient with appt time.

## 2012-09-18 NOTE — Telephone Encounter (Signed)
Message copied by Tobin Chad on Wed Sep 18, 2012  9:30 AM ------      Message from: [facility], DAVID      Created: Wed Sep 11, 2012 11:49 AM       Still with some clot present.        Can we see how his referral to [facility] is going?            Marykay Lex, MD       ------

## 2012-09-23 ENCOUNTER — Telehealth: Payer: Self-pay | Admitting: *Deleted

## 2012-09-23 ENCOUNTER — Ambulatory Visit: Payer: BC Managed Care – PPO | Admitting: Cardiology

## 2012-09-23 NOTE — Telephone Encounter (Signed)
Result of doppler given. Pt states he had a consultation with  Human resources officer. They have schedule surgery for July 21,2014 to remove the first left rib. He wanted to know if the clot would hinder his surgery. Informed patient that I will as Dr Herbie Baltimore.

## 2012-09-23 NOTE — Telephone Encounter (Signed)
Left message to call back  

## 2012-09-23 NOTE — Telephone Encounter (Signed)
I do not think so -- this is the reason for the surgery.Marland Kitchen  Marykay Lex, MD

## 2012-09-23 NOTE — Telephone Encounter (Signed)
Message copied by Tobin Chad on Mon Sep 23, 2012 11:18 AM ------      Message from: Herbie Baltimore, DAVID      Created: Wed Sep 11, 2012 11:49 AM       Still with some clot present.        Can we see how his referral to [facility] is going?            Marykay Lex, MD       ------

## 2012-09-23 NOTE — Telephone Encounter (Signed)
Left message for patient from Dr Herbie Baltimore. Surgery is for that problem.

## 2012-09-23 NOTE — Telephone Encounter (Signed)
Message copied by Tobin Chad on Mon Sep 23, 2012  1:55 PM ------      Message from: [facility], DAVID      Created: Wed Sep 11, 2012 11:49 AM       Still with some clot present.        Can we see how his referral to [facility] is going?            Marykay Lex, MD       ------

## 2012-09-30 ENCOUNTER — Ambulatory Visit: Payer: BC Managed Care – PPO | Admitting: Cardiology

## 2012-10-08 ENCOUNTER — Ambulatory Visit: Payer: BC Managed Care – PPO | Admitting: Cardiology

## 2012-10-22 ENCOUNTER — Encounter: Payer: Self-pay | Admitting: Cardiology

## 2012-10-22 ENCOUNTER — Ambulatory Visit (INDEPENDENT_AMBULATORY_CARE_PROVIDER_SITE_OTHER): Payer: BC Managed Care – PPO | Admitting: Cardiology

## 2012-10-22 VITALS — BP 124/78 | HR 72 | Ht 68.5 in | Wt 154.8 lb

## 2012-10-22 DIAGNOSIS — I82A19 Acute embolism and thrombosis of unspecified axillary vein: Secondary | ICD-10-CM

## 2012-10-22 DIAGNOSIS — G540 Brachial plexus disorders: Secondary | ICD-10-CM

## 2012-10-22 DIAGNOSIS — I82A12 Acute embolism and thrombosis of left axillary vein: Secondary | ICD-10-CM

## 2012-10-22 NOTE — Patient Instructions (Addendum)
Good luck with your surgery -- You should do well!!  We need to clarify with your surgeon -- do we just complete the planned 3 months of Xarelto.  If he could send me your operative notes & discharge summary, I would appreciate it.   I will give you my card to give to him.    I would like to see you back ~ 1 month post-operatively.  Marykay Lex, MD

## 2012-10-25 NOTE — Progress Notes (Signed)
Patient ID: Trevor Spence, male   DOB: 07-Jul-1980, 32 y.o.   MRN: [id number]  Clinic Note: HPI: Trevor Spence is a 32 y.o. male with recent the lesion for left upper to mid DVT as a result of thoracic outlet syndrome.  Trevor Spence was admitted on 08/19/2012 with extremely swollen left arm. He was diagnosed with thoracic outlet syndrome by CT scan. He was then referred to his vascular radiology and vascular surgery. He underwent direct thrombolysis of a left subclavian/axillary vein thrombus. The diagnosis of thoracic yellow syndrome was confirmed and he is now been referred to [facility] Dr. Denyse Dago. He was discharged on Xarelto for DVT treatment. He is now here for routine followup prior to his rib or section surgery scheduled July 21st..  Interval History: I saw him back a month or so ago and he improved significantly. He is now has minimal at all in his arm. He related having significant symptoms of a tightness or pressure in the arm. No further bruising noted. He is a little concerned about the surgery and has questions to ask that I answered to the best my ability.    he is otherwise doing quite well overall. He denies any palpitations or sudden increases in heart rate or shortness of breath that would be concerning for PE.  He denies any chest pain or shortness of breath or exertion. he denies any melena hematochezia hematuria or nosebleeds.   Cardiovascular ROS: negative Additional cardiac review of systems: Lightheadedness - no, dizziness -no, syncope/near-syncope - no; TIA/amaurosis fugax - no  No Known Allergies  Current Outpatient Prescriptions  Medication Sig Dispense Refill  . fluocinonide cream (LIDEX) 0.05 % Apply 1 application topically as needed.       . Rivaroxaban (XARELTO) 20 MG TABS Take 1 tablet (20 mg total) by mouth daily with supper.  30 tablet  5   No current facility-administered medications for this visit.    Past Medical History    Diagnosis Date  . Injury of left shoulder   . DVT of left axillary vein, acute 08/20/2012    s/p IR - directed thrombolysis  . Thoracic outlet syndrome 08/20/2012    Venous obstruction    Past Surgical History  Procedure Laterality Date  . Deep neck lymph node biopsy / excision    . Thrombolysis Left 08/23/2012    L Subclavian - Axillary Vein, due to TOS    History   Social History  . Marital Status: Married    Spouse Name: N/A    Number of Children: N/A  . Years of Education: N/A   Occupational History  . 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 Smoking status: Former Smoker    Quit date: 04/10/2009  . Smokeless tobacco: Never Used     Comment: use smok when having a drink  . Alcohol Use: Yes     Comment: Occasionally  . Drug Use: No  . Sexually Active: Yes   Other Topics Concern  . Not on file   Social History Narrative   Currently working in [place] where he has an apartment but also has a house in [place].    ROS: A comprehensive Review of Systems - Negative except Pertinent positives noted above.  PHYSICAL EXAM BP 124/78  Pulse 72  Ht 5' 8.5" (1.74 m)  Wt 154 lb 12.8 oz (70.217 kg)  BMI 23.19 kg/m2 General appearance: alert,  cooperative, appears stated age, no distress and Pleasant mood and affect Neck: no adenopathy, no carotid bruit, no JVD and supple, symmetrical, trachea midline Lungs: clear to auscultation bilaterally, normal percussion bilaterally and Nonlabored Heart: regular rate and rhythm, S1, S2 normal, no murmur, click, rub or gallop Extremities: No appreciable swelling in the left arm.  Skin: Skin color, texture, turgor normal. No rashes or lesions or varicosities - shoulder(s) left  [id number] today: No  ASSESSMENT/ PLAN: Thoracic outlet syndrome - with left subclavian and axillary vein thrombosis status post thrombolysis Scheduled for surgery July 21 with Dr. Ewing Schlein at [facility]. We will simply  need to get instructions from his surgeon as to the duration of Xarelto without change postoperatively.  DVT of left axillary vein, acute, and Lt subclavian vein Overall, progression as expected status post thrombolysis of left subclavian vein/axillary vein thrombosis. Tolerating Xarelto without any complaints.    Follow up with me in  2-3 months, after his surgery to determine the length of Xarelto treatment postoperatively.    Marykay Lex, M.D., M.S. [facility] [address] [place], [place]  [address]  [phone] Pager # [phone] 10/25/2012 5:34 PM

## 2012-10-25 NOTE — Assessment & Plan Note (Signed)
Scheduled for surgery July 21 with Dr. Ewing Schlein at [facility]. We will simply need to get instructions from his surgeon as to the duration of Xarelto without change postoperatively.

## 2012-10-25 NOTE — Assessment & Plan Note (Signed)
Overall, progression as expected status post thrombolysis of left subclavian vein/axillary vein thrombosis. Tolerating Xarelto without any complaints.

## 2012-11-01 ENCOUNTER — Other Ambulatory Visit: Payer: Self-pay | Admitting: *Deleted

## 2012-11-01 MED ORDER — RIVAROXABAN 20 MG PO TABS: 20.0000 mg | ORAL_TABLET | Freq: Every day | ORAL | Status: DC

## 2012-11-01 NOTE — Telephone Encounter (Signed)
Rx was sent to pharmacy electronically. 

## 2012-12-03 ENCOUNTER — Ambulatory Visit: Payer: BC Managed Care – PPO | Admitting: Cardiology

## 2014-11-15 IMAGING — CT CT CHEST W/ CM
2 of 4 series · 15 of 36 positions shown, 18 images · IV contrast (APPLIED)
Comparison: None.

CLINICAL DATA: Left upper extremity thrombus.  Suspected thoracic
outlet syndrome.

CT CHEST WITH CONTRAST
TECHNIQUE: Multidetector CT imaging of the chest was performed
following the standard protocol during bolus administration of
intravenous contrast.
Contrast: 80mL OMNIPAQUE IOHEXOL 300 MG/ML  SOLN

[Series 2: routine chest 5.0 st · axial · 0.85mm/px · z∈[+972,+1312]mm · 12 of 80 slices shown, 15 images]
[im 6/80  mediastinal]
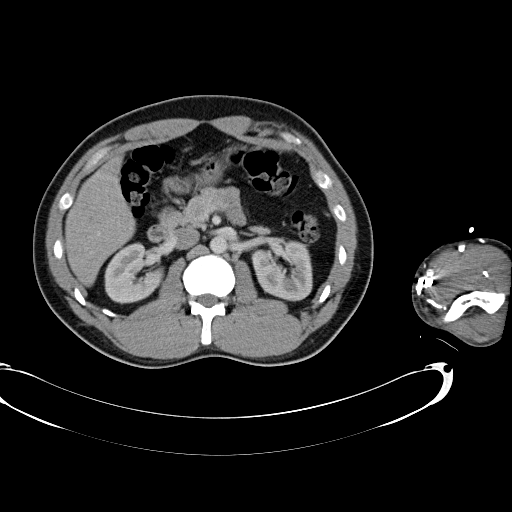
[im 6/80  lung]
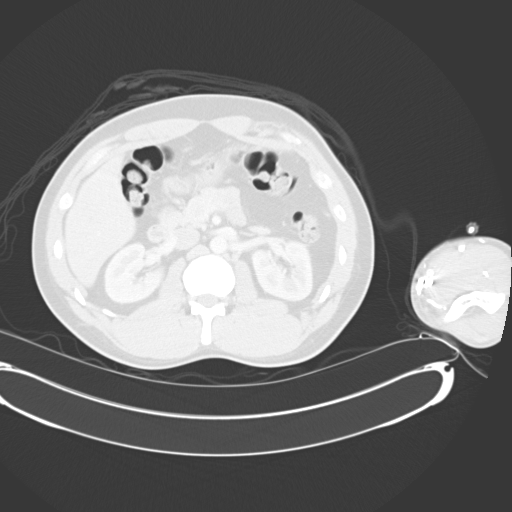
[im 11/80  lung]
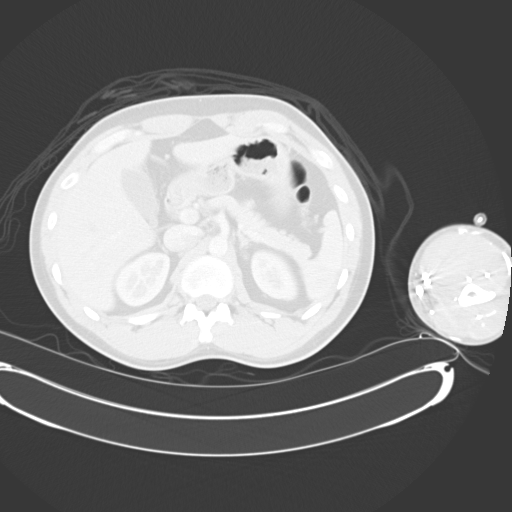
[im 16/80  lung]
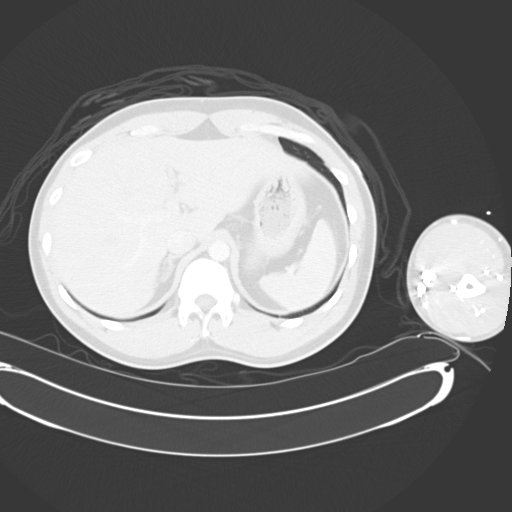
[im 27/80  lung]
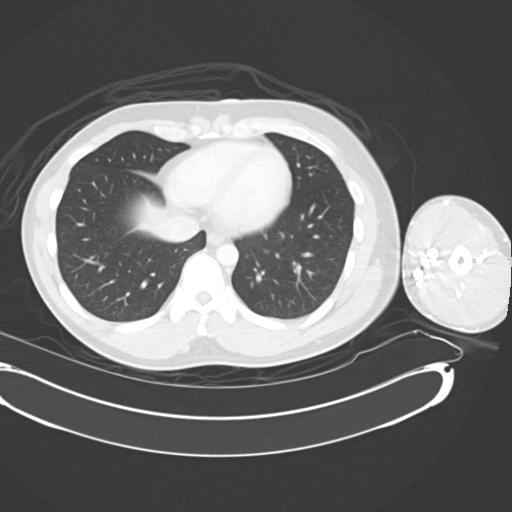
[im 32/80  mediastinal]
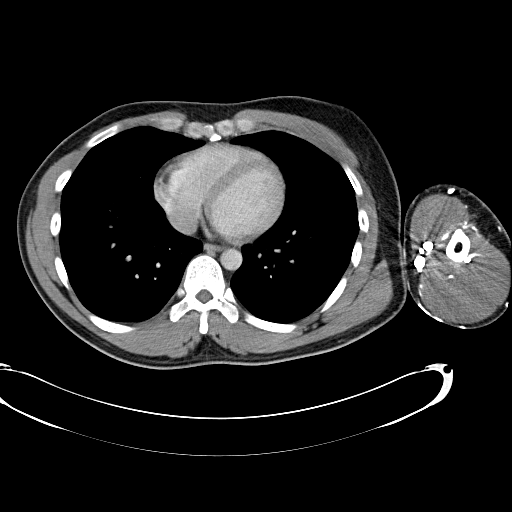
[im 32/80  lung]
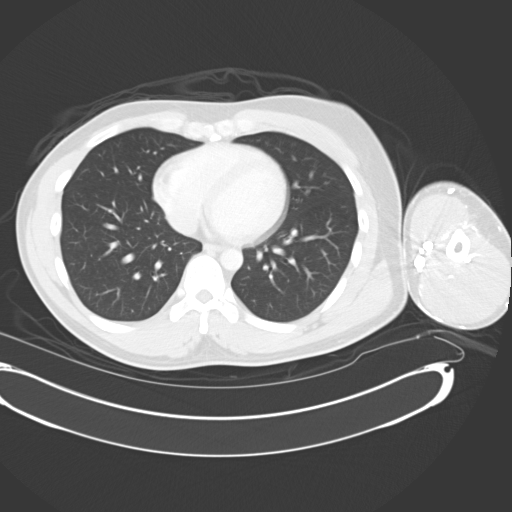
[im 37/80  lung]
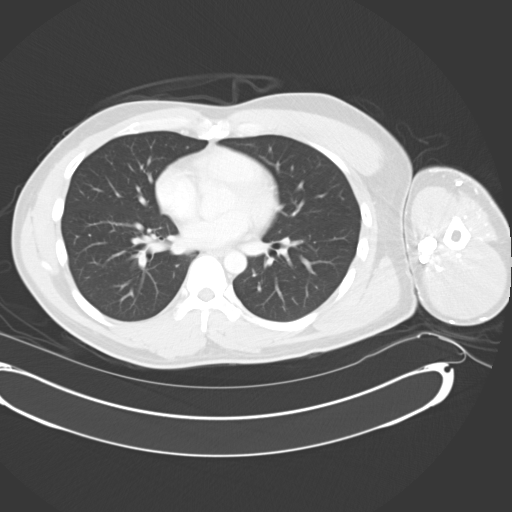
[im 43/80  lung]
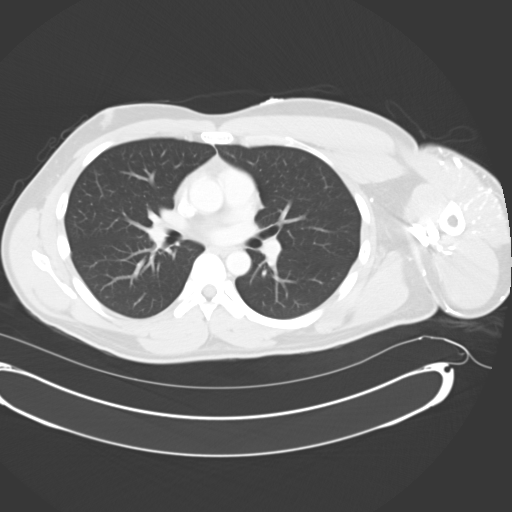
[im 48/80  lung]
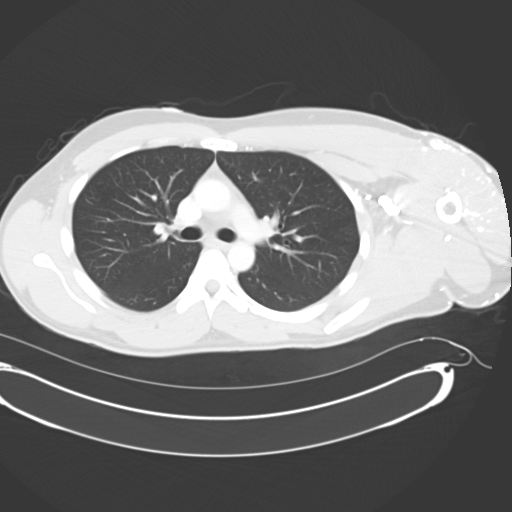
[im 53/80  mediastinal]
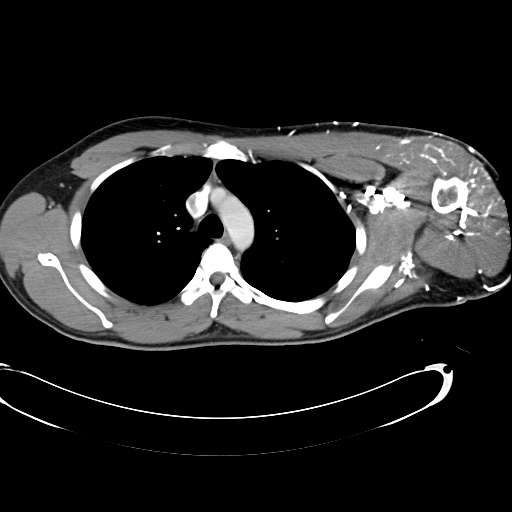
[im 53/80  lung]
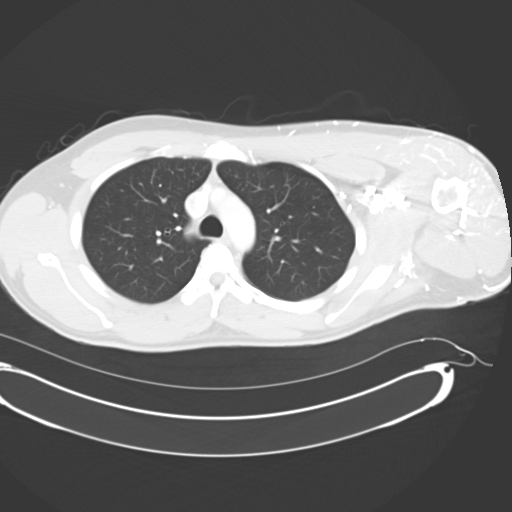
[im 64/80  lung]
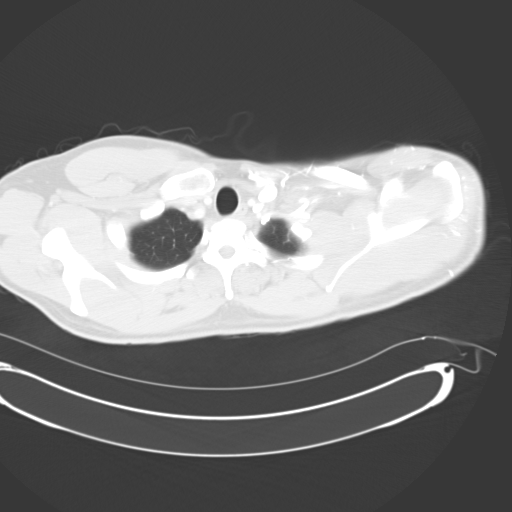
[im 69/80  lung]
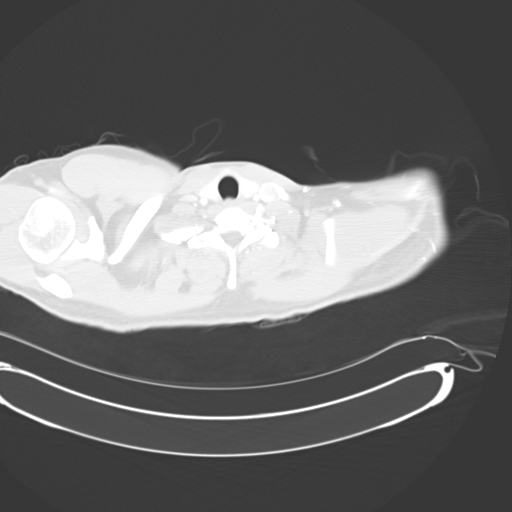
[im 74/80  lung]
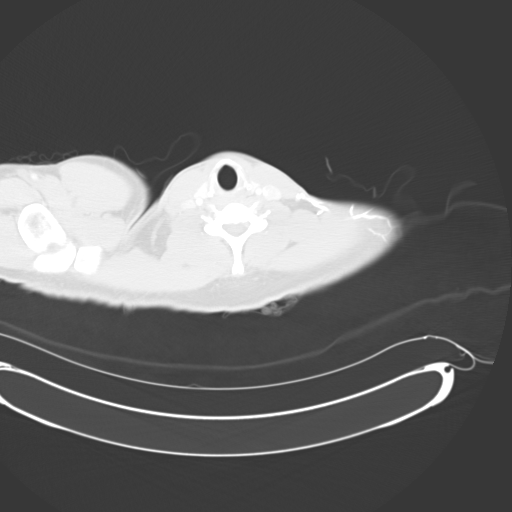

[Series 602: cor · coronal · 0.85mm/px · 3 of 113 slices shown]
[im 23/113  lung]
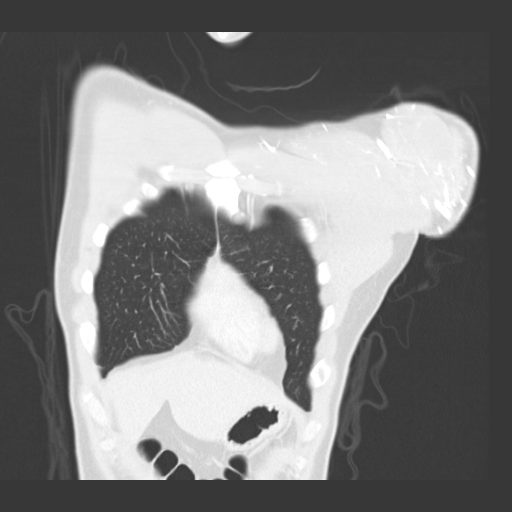
[im 45/113  lung]
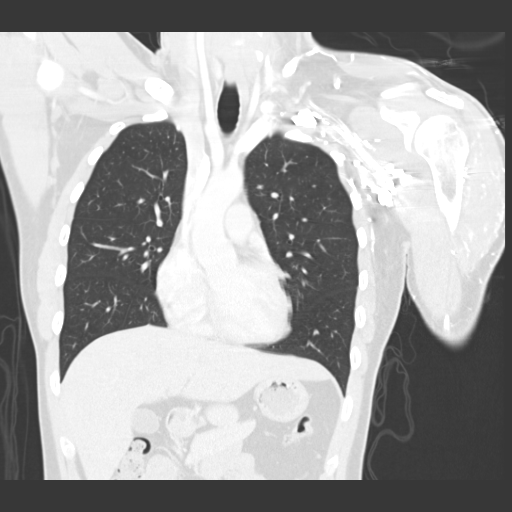
[im 68/113  lung]
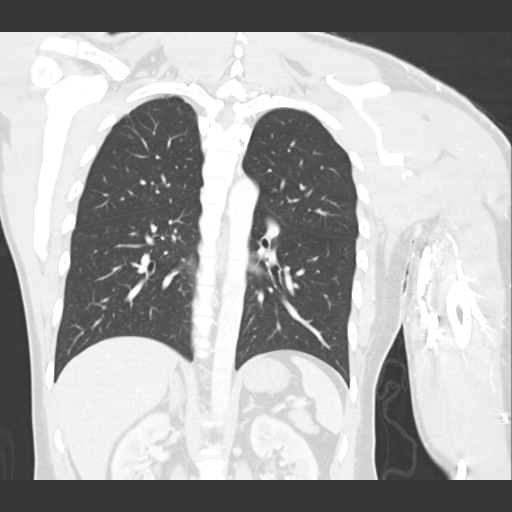

[15 of 36 positions shown; findings below may reference images not displayed]

FINDINGS: Injection of contrast was performed in the left upper
extremity.  This demonstrates filling defects within the left
subclavian vein and central left axillary vein.  The subclavian
vein is not well visualized near the thoracic inlet.  Extensive
collateral venous channels noted in the left upper extremity and
upper chest/neck with drainage through the left internal jugular
vein.

Heart is normal size.  Aorta is normal caliber. No mediastinal,
hilar, or axillary adenopathy.  The Lungs are clear.  No focal
airspace opacities or suspicious nodules.  No effusions. Imaging
into the upper abdomen shows no acute findings.  Tiny low density
area within the superior right hepatic lobe is too small to
characterize, most likely small cysts.

No acute bony abnormality.
IMPRESSION: Thrombus seen within the central left axillary vein and left
subclavian vein.  The left subclavian vein is difficult to
visualize near the thoracic inlet.  Extensive collateral venous
channels in the left neck and chest with drainage through the left
internal jugular vein.

## 2024-02-12 ENCOUNTER — Ambulatory Visit: Admitting: Urology

## 2024-02-12 ENCOUNTER — Encounter: Payer: Self-pay | Admitting: Urology

## 2024-02-12 VITALS — BP 142/94 | HR 75 | Ht 68.0 in | Wt 157.0 lb

## 2024-02-12 DIAGNOSIS — Z3009 Encounter for other general counseling and advice on contraception: Secondary | ICD-10-CM

## 2024-02-12 MED ORDER — ALPRAZOLAM 1 MG PO TABS
ORAL_TABLET | ORAL | 0 refills | Status: AC
Start: 2024-02-12 — End: ?

## 2024-02-12 NOTE — Progress Notes (Signed)
 Assessment: 1. Encounter for vasectomy assessment     Plan: Schedule for vasectomy per patient request Rx for alprazolam 1 mg pre-procedure provided.   Chief Complaint:  Chief Complaint  Patient presents with   VAS Consult    History of Present Illness:  Trevor Spence is a 43 y.o. male who is seen for vasectomy evaluation. He is not married.  He has 2 children.  No history of scrotal trauma or infection.  Past Medical History:  Past Medical History:  Diagnosis Date   DVT of left axillary vein, acute ([facility]CC) 08/20/2012   s/p IR - directed thrombolysis   Injury of left shoulder    Thoracic outlet syndrome 08/20/2012   Venous obstruction    Past Surgical History:  Past Surgical History:  Procedure Laterality Date   DEEP NECK LYMPH NODE BIOPSY / EXCISION     THROMBOLYSIS Left 08/23/2012   L Subclavian - Axillary Vein, due to TOS    Allergies:  No Known Allergies  Family History:  No family history on file.  Social History:  Social History   Tobacco Use   Smoking status: Former    Current packs/day: 0.00    Types: Cigarettes    Quit date: 04/10/2009    Years since quitting: 14.8   Smokeless tobacco: Never   Tobacco comments:    use smok when having a drink  Substance Use Topics   Alcohol use: Yes    Comment: Occasionally   Drug use: No    Review of symptoms:  Constitutional:  Negative for unexplained weight loss, night sweats, fever, chills ENT:  Negative for nose bleeds, sinus pain, painful swallowing CV:  Negative for chest pain, shortness of breath, exercise intolerance, palpitations, loss of consciousness Resp:  Negative for cough, wheezing, shortness of breath GI:  Negative for nausea, vomiting, diarrhea, bloody stools GU:  Positives noted in HPI; otherwise negative for gross hematuria, dysuria, urinary incontinence Neuro:  Negative for seizures, poor balance, limb weakness, slurred speech Psych:  Negative for lack of energy, depression,  anxiety Endocrine:  Negative for polydipsia, polyuria, symptoms of hypoglycemia (dizziness, hunger, sweating) Hematologic:  Negative for anemia, purpura, petechia, prolonged or excessive bleeding, use of anticoagulants  Allergic:  Negative for difficulty breathing or choking as a result of exposure to anything; no shellfish allergy; no allergic response (rash/itch) to materials, foods  Physical exam: BP (!) 142/94   Pulse 75   Ht 5' 8 (1.727 m)   Wt 157 lb (71.2 kg)   BMI 23.87 kg/m  GENERAL APPEARANCE:  Well appearing, well developed, well nourished, NAD HEENT:  Atraumatic, normocephalic, oropharynx clear NECK:  Supple without lymphadenopathy or thyromegaly ABDOMEN:  Soft, non-tender, no masses EXTREMITIES:  Moves all extremities well, without clubbing, cyanosis, or edema NEUROLOGIC:  Alert and oriented x 3, normal gait, CN II-XII grossly intact MENTAL STATUS:  appropriate BACK:  Non-tender to palpation, No CVAT SKIN:  Warm, dry, and intact GU: Penis:  uncircumcised Meatus: Normal Scrotum: vas palpated bilaterally Testis: normal without masses bilateral Epididymis: normal  Results: None  VASECTOMY CONSULTATION  Trevor Spence presents for vasectomy consultation today.  He is a 43 y.o. male, Single with 2 children.  He is aware of the issues regarding long-term fertility and is comfortable with this decision.  He presents for consideration for vasectomy.  I discussed the issues in detail with him today and he expressed no reservations.  As to the procedure, no scalpel technique vasectomy is explained and reviewed in detail.  Generalized risks including but not limited to bleeding, infection, orchalgia, testicular atrophy, epididymitis, scrotal hematoma, and chronic pain are discussed.   Additionally, he understands that the possibility of vas recanalization following vasectomy is possible although rare.  Most importantly, the patient understands that he is not sterile  initially and will need a semen analysis check to confirm sterility such that no sperm are seen.  He is advised to avoid ejaculation for 10 days following the procedure.  The initial semen analysis will be checked in approximately 12 weeks and in some patients, several months may be required for clearance of all sperm.  He reports a clear understanding of the need for continued birth control until sterility is confirmed.  Otherwise, general issues regarding local anesthesia, prep, alprazolam are discussed and he reports a clear understanding.

## 2024-02-12 NOTE — Patient Instructions (Signed)
 Vasectomy is a safe, simple, and effective office based procedure that provides men with permanent sterility.  The no scalpel technique has been a refinement but often results in less swelling and pain than the traditional vasectomy method.  Prior to a vasectomy, it is important to make a decision that you are interested in permanent sterility and that you and your partner must be completely sure that you do not want children in the future.  To prepare for the procedure, stop taking any aspirin or blood thinners for 1 week prior to the procedure.  The day of your procedure take a shower and thoroughly clean your scrotum.  It is not necessary to shave prior to the procedure as any shaving that is needed will be performed in the office.  Bring a pair of tight underwear such as briefs, boxer briefs, or athletic shorts with you for the day of the procedure.  You may eat a light meal prior to the procedure.  During the procedure, the scrotal skin will be sterilized completely.  Anesthesia will be provided by injection of a local anesthetic into the scrotum which will provide anesthesia for 2-3 hours after the procedure.  Once the local anesthetic takes effect, a tiny puncture is made in the middle of the front of the scrotum through which both vasa deferens tubes can be partially removed and the ends of the tubes cauterized.  A small absorbable suture is placed in the skin incision.  Antibiotic ointment and sterile gauze dressings are applied and are held in place with the undergarment.  Prescriptions for an antibiotic and pain medication will be sent to your pharmacy.  The antibiotic should be taken to completion.  The pain medication can be taken as needed every 4-6 hours.  If a narcotic pain medication is too strong, over-the-counter analgesics such as Tylenol or ibuprofen may be taken instead.  After the procedure, it is important to take it easy for a couple of days and apply the ice pack to the scrotal area.   The ice pack goes on top of the undergarment and should be used for 10-15 minutes at a time while you are awake.  After the first 2 days, you can gradually increase your activity.  During the first week, it is important to avoid strenuous activity or activity that puts pressure in the scrotal area.  It is also advisable to restrain from heavy lifting or long distance running during this time period.  Often, supportive underwear is helpful to reduce pain during the first week.  You should also avoid sexual activity for 10 days.  You can resume normal activities after 1 week and sexual relations in 10 days.  One of the most important considerations after vasectomy is that you are still fertile after the procedure.  It generally takes at least 20 ejaculations and 3 months time before you are considered sterile.  Even 3 months after the procedure, some men will have a few persistent sperm present.  To be considered sterile, you will need to produce a semen sample that shows no sperm.  You will receive instructions to bring in your first semen specimen to the office 3 months after the procedure.  It is very important that you continue to use your current method of birth control during this time as it is possible to achieve a pregnancy until you become sterile.  Potential complications of vasectomy include bleeding (less than 1% risk of serious bleeding), infection (less than 1%), reconnection of  the vas deferens (04/998 chance), pregnancy (04/1998 chance), shrinkage of the testicle (04/4998 chance), and chronic pain (2-4%).  Other potential consequences of vasectomy include sperm granuloma (a small round area of scar tissue in the area of the procedure is performed), and congestion of the epididymis (a fullness of the tubes where sperm are stored).  Sperm will continue to be produced by the testicles, but the sperm will eventually die and be absorbed by the body.  The amount of semen that is produced will not change.   The main difference is that the semen will not contain sperm after a man has become sterile.  The procedure does not affect your urination, sex drive, or erections.  In summary, vasectomy provides permanent birth control for men.  It is an office-based procedure which is safe, effective, and economical.  After the procedure, it takes time to become sterile so proper precautions must be taken until sterility is achieved.    Taking care of yourself after a VASECTOMY                                              Patient Information Sheet        The following information will reinforce some of the instructions that your doctor has given you.  Day of Procedure: 1) Wear the scrotal supporter and gauze pad 2) Use an ice pack on the scrotum for 15 minutes every hour for 48 hours to help reduce discomfort, swelling and bruising (do NOT place ice directly on your skin, but place on top of the supporter) 3) Expect some clear to pinkish drainage at the surgical site for the first 24-48 hours 4) If needed, use pain medications provided or ibuprofen 800 mg every 8 hours for discomfort 5) Avoid strenuous activities like mowing, lifting, jogging and exercising for 1 week.  Take it easy! 6) If you develop a fever over 101 F or sudden onset of significant swelling within the first 12 hours, please call to report this to your doctor as soon as possible.     Day Two and Three: 1) You may take a shower, but avoid tubs, pools or hot tubs. 2) Continue to wear the scrotal supporter as needed for comfort and change or remove the gauze pad if desired 3) Keep taking it easy!  Avoid strenuous activities like mowing, lifting, jogging and exercising.   4) Continue to watch for signs or symptoms of fever or significant swelling 5) Apply a small amount of antibiotic ointment to incision 1-2 times/day  The rest of the week: 1) Gradually return to normal physical activities after one week.  A return of soreness might  mean you are        "doing too much too soon". 2) Avoid sexual activity for 10 days after the procedure 3) Continue to take a shower, but avoid tubs, pools or hot tubs 4) Wearing the scrotal supporter is optional based on your comfort.     Remember to use an alternate form of contraception for 3 months until you have been checked and CLEARED by your urologist!  62-Month lab appointment:  1) The lab technician will need to look at a semen sample under a microscope  2) Use the specimen cup provided to collect the sample AT HOME 1 hour before the appointment  3) DO NOT refrigerate the specimen, but keep  at room or body temperature  4) Avoid ejaculation for 2-5 days before collecting the specimen  5) Collect the entire specimen by masturbation using NO lubricant  6) Make sure your name, MR number, date and time of collection are on the cup

## 2024-03-17 ENCOUNTER — Ambulatory Visit (INDEPENDENT_AMBULATORY_CARE_PROVIDER_SITE_OTHER): Admitting: Urology

## 2024-03-17 ENCOUNTER — Other Ambulatory Visit: Payer: Self-pay | Admitting: Urology

## 2024-03-17 VITALS — BP 146/84 | HR 75 | Ht 68.0 in | Wt 158.0 lb

## 2024-03-17 DIAGNOSIS — Z302 Encounter for sterilization: Secondary | ICD-10-CM

## 2024-03-17 MED ORDER — HYDROCODONE-ACETAMINOPHEN 5-325 MG PO TABS: 1.0000 | ORAL_TABLET | Freq: Four times a day (QID) | ORAL | 0 refills | Status: DC | PRN

## 2024-03-17 MED ORDER — HYDROCODONE-ACETAMINOPHEN 5-325 MG PO TABS: 1.0000 | ORAL_TABLET | Freq: Four times a day (QID) | ORAL | 0 refills | Status: AC | PRN

## 2024-03-17 MED ORDER — CEPHALEXIN 500 MG PO CAPS: 500.0000 mg | ORAL_CAPSULE | Freq: Three times a day (TID) | ORAL | 0 refills | Status: DC

## 2024-03-17 MED ORDER — CEPHALEXIN 500 MG PO CAPS: 500.0000 mg | ORAL_CAPSULE | Freq: Three times a day (TID) | ORAL | 0 refills | Status: AC

## 2024-03-17 NOTE — Progress Notes (Signed)
   Assessment: 1. Encounter for vasectomy     Plan: Post vasectomy instructions given Rx sent. Post vasectomy semen analysis in 12 weeks   Chief Complaint:  Chief Complaint  Patient presents with   VAS    History of Present Illness:  Trevor Spence is a 43 y.o. male who is seen for vasectomy. He is not married.  He has 2 children.  No history of scrotal trauma or infection.  Past Medical History:  Past Medical History:  Diagnosis Date   DVT of left axillary vein, acute ([facility]CC) 08/20/2012   s/p IR - directed thrombolysis   Injury of left shoulder    Thoracic outlet syndrome 08/20/2012   Venous obstruction    Past Surgical History:  Past Surgical History:  Procedure Laterality Date   DEEP NECK LYMPH NODE BIOPSY / EXCISION     THROMBOLYSIS Left 08/23/2012   L Subclavian - Axillary Vein, due to TOS    Allergies:  No Known Allergies  Family History:  No family history on file.  Social History:  Social History   Tobacco Use   Smoking status: Former    Current packs/day: 0.00    Types: Cigarettes    Quit date: 04/10/2009    Years since quitting: 14.9   Smokeless tobacco: Never   Tobacco comments:    use smok when having a drink  Substance Use Topics   Alcohol use: Yes    Comment: Occasionally   Drug use: No    ROS: Constitutional:  Negative for fever, chills, weight loss CV: Negative for chest pain, previous MI, hypertension Respiratory:  Negative for shortness of breath, wheezing, sleep apnea, frequent cough GI:  Negative for nausea, vomiting, bloody stool, GERD  Physical exam: BP (!) 146/84   Pulse 75   Ht 5' 8 (1.727 m)   Wt 158 lb (71.7 kg)   BMI 24.02 kg/m  GENERAL APPEARANCE:  Well appearing, well developed, well nourished, NAD  Results: None  VASECTOMY PROCEDURE:  Trevor Spence presents for vasectomy following previous vasectomy consultation and permit is signed.  The patient's anterior scrotal wall is shaved and prepped with  Betadine in standard sterile fashion.  1% lidocaine is used as local anesthetic in the scrotal and peri vasal tissue.  A standard median raphe punch incision is made and a no scalpel technique vasectomy is performed.  Bilateral vas are isolated from the peri vasal tissue and an approximately 1 cm segment of vas is excised.  Proximal and distal segments are internally cauterized with electric heat cautery. Interposition of perivasal tissue was performed.  Bilateral palpation confirms bilateral vasectomy defect and no significant bleeding or hematoma is identified.  Neosporin gauze dressing and a scrotal support are applied.    Disposition: Patient is discharged home with Rx for pain medication and antibiotics.  Patient is given routine vasectomy instructions.   Most importantly, he is instructed and cautioned again regarding the need for protected intercourse until such time that a single  negative semen analysis has been obtained.  The initial semen analysis will be checked in approximately 12  weeks.  The patient reports a clear understanding.  He will call with any interval questions or concerns.

## 2024-03-17 NOTE — Addendum Note (Signed)
 Addended by: ROSEANN ADINE PARAS on: 03/17/2024 12:14 PM   Modules accepted: Orders

## 2024-03-17 NOTE — Patient Instructions (Signed)

## 2024-06-16 ENCOUNTER — Other Ambulatory Visit
